# Patient Record
Sex: Female | Born: 2000 | Race: White | Hispanic: No | Marital: Married | Smoking: Never smoker
Health system: Southern US, Community
[De-identification: ages and names within clinical notes are randomized; demographics above are authoritative.]

## PROBLEM LIST (undated history)

## (undated) DIAGNOSIS — Z789 Other specified health status: Secondary | ICD-10-CM

## (undated) HISTORY — DX: Other specified health status: Z78.9

## (undated) HISTORY — PX: OTHER SURGICAL HISTORY: SHX169

---

## 2019-09-01 ENCOUNTER — Ambulatory Visit (INDEPENDENT_AMBULATORY_CARE_PROVIDER_SITE_OTHER): Payer: 59 | Admitting: Adult Health

## 2019-09-01 ENCOUNTER — Encounter: Payer: Self-pay | Admitting: Adult Health

## 2019-09-01 VITALS — BP 127/83 | HR 117 | Ht 60.0 in | Wt 115.0 lb

## 2019-09-01 DIAGNOSIS — O3680X0 Pregnancy with inconclusive fetal viability, not applicable or unspecified: Secondary | ICD-10-CM | POA: Insufficient documentation

## 2019-09-01 DIAGNOSIS — Z3201 Encounter for pregnancy test, result positive: Secondary | ICD-10-CM | POA: Insufficient documentation

## 2019-09-01 DIAGNOSIS — Z3A01 Less than 8 weeks gestation of pregnancy: Secondary | ICD-10-CM | POA: Diagnosis not present

## 2019-09-01 LAB — POCT URINE PREGNANCY: Preg Test, Ur: POSITIVE — AB

## 2019-09-01 NOTE — Progress Notes (Signed)
  Subjective:     Patient ID: Tracey Hays, female   DOB: 04-Dec-2000, 19 y.o.   MRN: [id number]  HPI Tracey Hays is a 19 year old white female, married, in for UPT has missed a period, and had 5 HPTs +. Husband Tracey Hays is with her today.  PCP is Tracey Hays.  Review of Systems +missed period 5+HPTs Reviewed past medical,surgical, social and family history. Reviewed medications and allergies.     Objective:   Physical Exam BP 127/83 (BP Location: Left Arm, Patient Position: Sitting, Cuff Size: Normal)   Pulse (!) 117   Ht 5' (1.524 m)   Wt 115 lb (52.2 kg)   LMP 07/20/2019 (Exact Date)   BMI 22.46 kg/m  UPT is +, about 6+1 week by LMP with EDD 04/25/20. Skin warm and dry. Neck: mid line trachea, normal thyroid, good ROM, no lymphadenopathy noted. Lungs: clear to ausculation bilaterally. Cardiovascular: regular rate and rhythm. Abdomen is soft and non tender QQ is 0 Fall risk is low PHQ 9 score is 2, no SI     Assessment:     1. Positive pregnancy test Continue PNV  2. Less than [redacted] weeks gestation of pregnancy Eat often  3. Encounter to determine fetal viability of pregnancy, single or unspecified fetus Return in 2 weeks for dating [place]     Plan:     Review hand out on First Trimester and by [facility]

## 2019-09-01 NOTE — Patient Instructions (Signed)
First Trimester of Pregnancy The first trimester of pregnancy is from week 1 until the end of week 13 (months 1 through 3). A week after a sperm fertilizes an egg, the egg will implant on the wall of the uterus. This embryo will begin to develop into a baby. Genes from you and your partner will form the baby. The female genes will determine whether the baby will be a boy or a girl. At 6-8 weeks, the eyes and face will be formed, and the heartbeat can be seen on ultrasound. At the end of 12 weeks, all the baby's organs will be formed. Now that you are pregnant, you will want to do everything you can to have a healthy baby. Two of the most important things are to get good prenatal care and to follow your health care provider's instructions. Prenatal care is all the medical care you receive before the baby's birth. This care will help prevent, find, and treat any problems during the pregnancy and childbirth. Body changes during your first trimester Your body goes through many changes during pregnancy. The changes vary from woman to woman.  You may gain or lose a couple of pounds at first.  You may feel sick to your stomach (nauseous) and you may throw up (vomit). If the vomiting is uncontrollable, call your health care provider.  You may tire easily.  You may develop headaches that can be relieved by medicines. All medicines should be approved by your health care provider.  You may urinate more often. Painful urination may mean you have a bladder infection.  You may develop heartburn as a result of your pregnancy.  You may develop constipation because certain hormones are causing the muscles that push stool through your intestines to slow down.  You may develop hemorrhoids or swollen veins (varicose veins).  Your breasts may begin to grow larger and become tender. Your nipples may stick out more, and the tissue that surrounds them (areola) may become darker.  Your gums may bleed and may be  sensitive to brushing and flossing.  Dark spots or blotches (chloasma, mask of pregnancy) may develop on your face. This will likely fade after the baby is born.  Your menstrual periods will stop.  You may have a loss of appetite.  You may develop cravings for certain kinds of food.  You may have changes in your emotions from day to day, such as being excited to be pregnant or being concerned that something may go wrong with the pregnancy and baby.  You may have more vivid and strange dreams.  You may have changes in your hair. These can include thickening of your hair, rapid growth, and changes in texture. Some women also have hair loss during or after pregnancy, or hair that feels dry or thin. Your hair will most likely return to normal after your baby is born. What to expect at prenatal visits During a routine prenatal visit:  You will be weighed to make sure you and the baby are growing normally.  Your blood pressure will be taken.  Your abdomen will be measured to track your baby's growth.  The fetal heartbeat will be listened to between weeks 10 and 14 of your pregnancy.  Test results from any previous visits will be discussed. Your health care provider may ask you:  How you are feeling.  If you are feeling the baby move.  If you have had any abnormal symptoms, such as leaking fluid, bleeding, severe headaches, or abdominal   cramping.  If you are using any tobacco products, including cigarettes, chewing tobacco, and electronic cigarettes.  If you have any questions. Other tests that may be performed during your first trimester include:  Blood tests to find your blood type and to check for the presence of any previous infections. The tests will also be used to check for low iron levels (anemia) and protein on red blood cells (Rh antibodies). Depending on your risk factors, or if you previously had diabetes during pregnancy, you may have tests to check for high blood sugar  that affects pregnant women (gestational diabetes).  Urine tests to check for infections, diabetes, or protein in the urine.  An ultrasound to confirm the proper growth and development of the baby.  Fetal screens for spinal cord problems (spina bifida) and Down syndrome.  HIV (human immunodeficiency virus) testing. Routine prenatal testing includes screening for HIV, unless you choose not to have this test.  You may need other tests to make sure you and the baby are doing well. Follow these instructions at home: Medicines  Follow your health care provider's instructions regarding medicine use. Specific medicines may be either safe or unsafe to take during pregnancy.  Take a prenatal vitamin that contains at least 600 micrograms (mcg) of folic acid.  If you develop constipation, try taking a stool softener if your health care provider approves. Eating and drinking   Eat a balanced diet that includes fresh fruits and vegetables, whole grains, good sources of protein such as meat, eggs, or tofu, and low-fat dairy. Your health care provider will help you determine the amount of weight gain that is right for you.  Avoid raw meat and uncooked cheese. These carry germs that can cause birth defects in the baby.  Eating four or five small meals rather than three large meals a day may help relieve nausea and vomiting. If you start to feel nauseous, eating a few soda crackers can be helpful. Drinking liquids between meals, instead of during meals, also seems to help ease nausea and vomiting.  Limit foods that are high in fat and processed sugars, such as fried and sweet foods.  To prevent constipation: ? Eat foods that are high in fiber, such as fresh fruits and vegetables, whole grains, and beans. ? Drink enough fluid to keep your urine clear or pale yellow. Activity  Exercise only as directed by your health care provider. Most women can continue their usual exercise routine during  pregnancy. Try to exercise for 30 minutes at least 5 days a week. Exercising will help you: ? Control your weight. ? Stay in shape. ? Be prepared for labor and delivery.  Experiencing pain or cramping in the lower abdomen or lower back is a good sign that you should stop exercising. Check with your health care provider before continuing with normal exercises.  Try to avoid standing for long periods of time. Move your legs often if you must stand in one place for a long time.  Avoid heavy lifting.  Wear low-heeled shoes and practice good posture.  You may continue to have sex unless your health care provider tells you not to. Relieving pain and discomfort  Wear a good support bra to relieve breast tenderness.  Take warm sitz baths to soothe any pain or discomfort caused by hemorrhoids. Use hemorrhoid cream if your health care provider approves.  Rest with your legs elevated if you have leg cramps or low back pain.  If you develop varicose veins in   your legs, wear support hose. Elevate your feet for 15 minutes, 3-4 times a day. Limit salt in your diet. Prenatal care  Schedule your prenatal visits by the twelfth week of pregnancy. They are usually scheduled monthly at first, then more often in the last 2 months before delivery.  Write down your questions. Take them to your prenatal visits.  Keep all your prenatal visits as told by your health care provider. This is important. Safety  Wear your seat belt at all times when driving.  Make a list of emergency phone numbers, including numbers for family, friends, the hospital, and police and fire departments. General instructions  Ask your health care provider for a referral to a local prenatal education class. Begin classes no later than the beginning of month 6 of your pregnancy.  Ask for help if you have counseling or nutritional needs during pregnancy. Your health care provider can offer advice or refer you to specialists for help  with various needs.  Do not use hot tubs, steam rooms, or saunas.  Do not douche or use tampons or scented sanitary pads.  Do not cross your legs for long periods of time.  Avoid cat litter boxes and soil used by cats. These carry germs that can cause birth defects in the baby and possibly loss of the fetus by miscarriage or stillbirth.  Avoid all smoking, herbs, alcohol, and medicines not prescribed by your health care provider. Chemicals in these products affect the formation and growth of the baby.  Do not use any products that contain nicotine or tobacco, such as cigarettes and e-cigarettes. If you need help quitting, ask your health care provider. You may receive counseling support and other resources to help you quit.  Schedule a dentist appointment. At home, brush your teeth with a soft toothbrush and be gentle when you floss. Contact a health care provider if:  You have dizziness.  You have mild pelvic cramps, pelvic pressure, or nagging pain in the abdominal area.  You have persistent nausea, vomiting, or diarrhea.  You have a bad smelling vaginal discharge.  You have pain when you urinate.  You notice increased swelling in your face, hands, legs, or ankles.  You are exposed to fifth disease or chickenpox.  You are exposed to German measles (rubella) and have never had it. Get help right away if:  You have a fever.  You are leaking fluid from your vagina.  You have spotting or bleeding from your vagina.  You have severe abdominal cramping or pain.  You have rapid weight gain or loss.  You vomit blood or material that looks like coffee grounds.  You develop a severe headache.  You have shortness of breath.  You have any kind of trauma, such as from a fall or a car accident. Summary  The first trimester of pregnancy is from week 1 until the end of week 13 (months 1 through 3).  Your body goes through many changes during pregnancy. The changes vary from  woman to woman.  You will have routine prenatal visits. During those visits, your health care provider will examine you, discuss any test results you may have, and talk with you about how you are feeling. This information is not intended to replace advice given to you by your health care provider. Make sure you discuss any questions you have with your health care provider. Document Revised: 02/12/2017 Document Reviewed: 02/12/2016 Elsevier Patient Education  2020 [employer]  

## 2019-09-20 ENCOUNTER — Ambulatory Visit (INDEPENDENT_AMBULATORY_CARE_PROVIDER_SITE_OTHER): Payer: 59

## 2019-09-20 DIAGNOSIS — O3680X0 Pregnancy with inconclusive fetal viability, not applicable or unspecified: Secondary | ICD-10-CM

## 2019-09-20 DIAGNOSIS — Z3A01 Less than 8 weeks gestation of pregnancy: Secondary | ICD-10-CM | POA: Diagnosis not present

## 2019-09-20 NOTE — Progress Notes (Signed)
[place] 8+6 wks,single IUP with ys,positive fht 176 bpm,CRL 21.54 mm,normal right ovary,simple left ovarian cyst 6.4 x 6.3 x 5.9 cm

## 2019-09-21 ENCOUNTER — Telehealth: Payer: Self-pay | Admitting: Women's Health

## 2019-09-21 ENCOUNTER — Telehealth: Payer: Self-pay | Admitting: *Deleted

## 2019-09-21 NOTE — Telephone Encounter (Signed)
Patient states she read in the new ob packet to avoid deli meats and hot dogs. Advised she can have those in small amounts and but warmed.  Also wants to know if she can use sunscreen.  Advised that was fine.  No further questions.

## 2019-09-21 NOTE — Telephone Encounter (Signed)
Patient called stating that she was reading the paperwork that was given to her on her first visit and it states that she should avoid deli meat and hot dogs, pt would like to know which ones or all. Please contact pt

## 2019-10-11 ENCOUNTER — Encounter: Payer: Self-pay | Admitting: Advanced Practice Midwife

## 2019-10-11 ENCOUNTER — Other Ambulatory Visit: Payer: 59

## 2019-10-11 ENCOUNTER — Ambulatory Visit: Payer: 59 | Admitting: *Deleted

## 2019-10-11 ENCOUNTER — Ambulatory Visit (INDEPENDENT_AMBULATORY_CARE_PROVIDER_SITE_OTHER): Payer: 59 | Admitting: Advanced Practice Midwife

## 2019-10-11 VITALS — BP 119/78 | HR 100 | Wt 115.0 lb

## 2019-10-11 DIAGNOSIS — Z3401 Encounter for supervision of normal first pregnancy, first trimester: Secondary | ICD-10-CM | POA: Diagnosis not present

## 2019-10-11 DIAGNOSIS — Z3A11 11 weeks gestation of pregnancy: Secondary | ICD-10-CM

## 2019-10-11 DIAGNOSIS — Z3400 Encounter for supervision of normal first pregnancy, unspecified trimester: Secondary | ICD-10-CM | POA: Insufficient documentation

## 2019-10-11 LAB — POCT URINALYSIS DIPSTICK OB
Blood, UA: NEGATIVE
Glucose, UA: NEGATIVE
Ketones, UA: NEGATIVE
Leukocytes, UA: NEGATIVE
Nitrite, UA: NEGATIVE
POC,PROTEIN,UA: NEGATIVE

## 2019-10-11 NOTE — Progress Notes (Signed)
INITIAL OBSTETRICAL VISIT Patient name: Tracey Hays MRN [id number]  Date of birth: [birth year] Chief Complaint:   Initial Prenatal Visit  History of Present Illness:   Tracey Hays is a 19 y.o. G1P0 Caucasian female at [redacted]w[redacted]d by LMP c/w u/s at 8 weeks with an Estimated Date of Delivery: 04/25/20 being seen today for her initial obstetrical visit.   Her obstetrical history is significant for primigravida.   Today she reports no complaints.  Depression screen [facility] 2/9 10/11/2019 09/01/2019  Decreased Interest 0 0  Down, Depressed, Hopeless 1 1  PHQ - 2 Score 1 1  Altered sleeping 1 0  Tired, decreased energy 1 1  Change in appetite 0 0  Feeling bad or failure about yourself  0 0  Trouble concentrating 0 0  Moving slowly or fidgety/restless 0 0  Suicidal thoughts 0 0  PHQ-9 Score 3 2  Difficult doing work/chores - Not difficult at all    Patient's last menstrual period was 07/20/2019 (exact date). Last pap never (19yo)-declines today.  Review of Systems:   Pertinent items are noted in HPI Denies cramping/contractions, leakage of fluid, vaginal bleeding, abnormal vaginal discharge w/ itching/odor/irritation, headaches, visual changes, shortness of breath, chest pain, abdominal pain, severe nausea/vomiting, or problems with urination or bowel movements unless otherwise stated above.  Pertinent History Reviewed:  Reviewed past medical,surgical, social, obstetrical and family history.  Reviewed problem list, medications and allergies. OB History  Gravida Para Term Preterm AB Living  1            SAB TAB Ectopic Multiple Live Births               # Outcome Date GA Lbr Len/2nd Weight Sex Delivery Anes PTL Lv  1 Current            Physical Assessment:   Vitals:   10/11/19 1516  BP: 119/78  Pulse: 100  Weight: 115 lb (52.2 kg)  Body mass index is 22.46 kg/m.       Physical Examination:  General appearance - well appearing, and in no distress  Mental status - alert, oriented  to person, place, and time  Psych:  She has a normal mood and affect  Skin - warm and dry, normal color, no suspicious lesions noted  Chest - effort normal, all lung fields clear to auscultation bilaterally  Heart - normal rate and regular rhythm  Abdomen - soft, nontender  Extremities:  No swelling or varicosities noted  Pelvic - not indicated as declines Pap  Thin prep pap is not done  Declines NT; FHT dopplered at 169 bpm  Results for orders placed or performed in visit on 10/11/19 (from the past 24 hour(s))  POC Urinalysis Dipstick OB   Collection Time: 10/11/19  3:35 PM  Result Value Ref Range   Color, UA     Clarity, UA     Glucose, UA Negative Negative   Bilirubin, UA     Ketones, UA neg    Spec Grav, UA     Blood, UA neg    pH, UA     POC,PROTEIN,UA Negative Negative, Trace, Small (1+), Moderate (2+), Large (3+), 4+   Urobilinogen, UA     Nitrite, UA neg    Leukocytes, UA Negative Negative   Appearance     Odor      Assessment & Plan:  1) Low-Risk Pregnancy G1P0 at [redacted]w[redacted]d with an Estimated Date of Delivery: 04/25/20   2) Initial OB visit; declines genetic  screening   Meds: No orders of the defined types were placed in this encounter.   Initial labs obtained Continue prenatal vitamins Reviewed n/v relief measures and warning s/s to report Reviewed recommended weight gain based on pre-gravid BMI Encouraged well-balanced diet Genetic & carrier screening discussed: declines Panorama, NT/IT, AFP and Horizon 14  Ultrasound discussed; fetal survey: requested CCNC completed> form faxed if has or is planning to apply for medicaid The nature of [employer] for [employer] with multiple MDs and other Advanced Practice Providers was explained to patient; also emphasized that fellows, residents, and students are part of our team. Ordered home bp cuff. Check bp weekly, let us know if >140/90.   No indications for ASA therapy or early Hgb A1c (per  uptodate)  Follow-up: Return in about 4 weeks (around 11/08/2019) for LROB, in person.   Orders Placed This Encounter  Procedures  . GC/Chlamydia Probe Amp  . Urine Culture  . CBC/D/Plt+RPR+Rh+ABO+Rub Ab...  . Pain Management Screening Profile (10S)  . Hgb Fractionation Cascade  . POC Urinalysis Dipstick OB    Arabella Merles [facility] 10/11/2019 4:12 PM

## 2019-10-11 NOTE — Patient Instructions (Signed)
[employer], I greatly value your feedback.  If you receive a survey following your visit with [place] today, we appreciate you taking the time to fill it out.  Thanks, Philipp Deputy, CNM   [facility] ([address] [place], [place] [address]) Entrance C, located off of [address] Free 24/7 valet parking   Nausea & Vomiting  Have saltine crackers or pretzels by your bed and eat a few bites before you raise your head out of bed in the morning  Eat small frequent meals throughout the day instead of large meals  Drink plenty of fluids throughout the day to stay hydrated, just don't drink a lot of fluids with your meals.  This can make your stomach fill up faster making you feel sick  Do not brush your teeth right after you eat  Products with real ginger are good for nausea, like ginger ale and ginger hard candy Make sure it says made with real ginger!  Sucking on sour candy like lemon heads is also good for nausea  If your prenatal vitamins make you nauseated, take them at night so you will sleep through the nausea  Sea Bands  If you feel like you need medicine for the nausea & vomiting please let us know  If you are unable to keep any fluids or food down please let us know   Constipation  Drink plenty of fluid, preferably water, throughout the day  Eat foods high in fiber such as fruits, vegetables, and grains  Exercise, such as walking, is a good way to keep your bowels regular  Drink warm fluids, especially warm prune juice, or decaf coffee  Eat a 1/2 cup of real oatmeal (not instant), 1/2 cup applesauce, and 1/2-1 cup warm prune juice every day  If needed, you may take Colace (docusate sodium) stool softener once or twice a day to help keep the stool soft.   If you still are having problems with constipation, you may take Miralax once daily as needed to help keep your bowels regular.   Home Blood Pressure Monitoring for Patients   Your  provider has recommended that you check your blood pressure (BP) at least once a week at home. If you do not have a blood pressure cuff at home, one will be provided for you. Contact your provider if you have not received your monitor within 1 week.   Helpful Tips for Accurate Home Blood Pressure Checks  . Don't smoke, exercise, or drink caffeine 30 minutes before checking your BP . Use the restroom before checking your BP (a full bladder can raise your pressure) . Relax in a comfortable upright chair . Feet on the ground . Left arm resting comfortably on a flat surface at the level of your heart . Legs uncrossed . Back supported . Sit quietly and don't talk . Place the cuff on your bare arm . Adjust snuggly, so that only two fingertips can fit between your skin and the top of the cuff . Check 2 readings separated by at least one minute . Keep a log of your BP readings . For a visual, please reference this diagram: http://ccnc.care/bpdiagram  Provider Name: [facility]     Phone: [phone]  Zone 1: ALL CLEAR  Continue to monitor your symptoms:  . BP reading is less than 140 (top number) or less than 90 (bottom number)  . No right upper stomach pain . No headaches or seeing spots .  No feeling nauseated or throwing up . No swelling in face and hands  Zone 2: CAUTION Call your doctor's office for any of the following:  . BP reading is greater than 140 (top number) or greater than 90 (bottom number)  . Stomach pain under your ribs in the middle or right side . Headaches or seeing spots . Feeling nauseated or throwing up . Swelling in face and hands  Zone 3: EMERGENCY  Seek immediate medical care if you have any of the following:  . BP reading is greater than160 (top number) or greater than 110 (bottom number) . Severe headaches not improving with Tylenol . Serious difficulty catching your breath . Any worsening symptoms from Zone 2    First Trimester of Pregnancy The  first trimester of pregnancy is from week 1 until the end of week 12 (months 1 through 3). A week after a sperm fertilizes an egg, the egg will implant on the wall of the uterus. This embryo will begin to develop into a baby. Genes from you and your partner are forming the baby. The female genes determine whether the baby is a boy or a girl. At 6-8 weeks, the eyes and face are formed, and the heartbeat can be seen on ultrasound. At the end of 12 weeks, all the baby's organs are formed.  Now that you are pregnant, you will want to do everything you can to have a healthy baby. Two of the most important things are to get good prenatal care and to follow your health care provider's instructions. Prenatal care is all the medical care you receive before the baby's birth. This care will help prevent, find, and treat any problems during the pregnancy and childbirth. BODY CHANGES Your body goes through many changes during pregnancy. The changes vary from woman to woman.   You may gain or lose a couple of pounds at first.  You may feel sick to your stomach (nauseous) and throw up (vomit). If the vomiting is uncontrollable, call your health care provider.  You may tire easily.  You may develop headaches that can be relieved by medicines approved by your health care provider.  You may urinate more often. Painful urination may mean you have a bladder infection.  You may develop heartburn as a result of your pregnancy.  You may develop constipation because certain hormones are causing the muscles that push waste through your intestines to slow down.  You may develop hemorrhoids or swollen, bulging veins (varicose veins).  Your breasts may begin to grow larger and become tender. Your nipples may stick out more, and the tissue that surrounds them (areola) may become darker.  Your gums may bleed and may be sensitive to brushing and flossing.  Dark spots or blotches (chloasma, mask of pregnancy) may develop on  your face. This will likely fade after the baby is born.  Your menstrual periods will stop.  You may have a loss of appetite.  You may develop cravings for certain kinds of food.  You may have changes in your emotions from day to day, such as being excited to be pregnant or being concerned that something may go wrong with the pregnancy and baby.  You may have more vivid and strange dreams.  You may have changes in your hair. These can include thickening of your hair, rapid growth, and changes in texture. Some women also have hair loss during or after pregnancy, or hair that feels dry or thin. Your hair will most  likely return to normal after your baby is born. WHAT TO EXPECT AT YOUR PRENATAL VISITS During a routine prenatal visit:  You will be weighed to make sure you and the baby are growing normally.  Your blood pressure will be taken.  Your abdomen will be measured to track your baby's growth.  The fetal heartbeat will be listened to starting around week 10 or 12 of your pregnancy.  Test results from any previous visits will be discussed. Your health care provider may ask you:  How you are feeling.  If you are feeling the baby move.  If you have had any abnormal symptoms, such as leaking fluid, bleeding, severe headaches, or abdominal cramping.  If you have any questions. Other tests that may be performed during your first trimester include:  Blood tests to find your blood type and to check for the presence of any previous infections. They will also be used to check for low iron levels (anemia) and Rh antibodies. Later in the pregnancy, blood tests for diabetes will be done along with other tests if problems develop.  Urine tests to check for infections, diabetes, or protein in the urine.  An ultrasound to confirm the proper growth and development of the baby.  An amniocentesis to check for possible genetic problems.  Fetal screens for spina bifida and Down  syndrome.  You may need other tests to make sure you and the baby are doing well. HOME CARE INSTRUCTIONS  Medicines  Follow your health care provider's instructions regarding medicine use. Specific medicines may be either safe or unsafe to take during pregnancy.  Take your prenatal vitamins as directed.  If you develop constipation, try taking a stool softener if your health care provider approves. Diet  Eat regular, well-balanced meals. Choose a variety of foods, such as meat or vegetable-based protein, fish, milk and low-fat dairy products, vegetables, fruits, and whole grain breads and cereals. Your health care provider will help you determine the amount of weight gain that is right for you.  Avoid raw meat and uncooked cheese. These carry germs that can cause birth defects in the baby.  Eating four or five small meals rather than three large meals a day may help relieve nausea and vomiting. If you start to feel nauseous, eating a few soda crackers can be helpful. Drinking liquids between meals instead of during meals also seems to help nausea and vomiting.  If you develop constipation, eat more high-fiber foods, such as fresh vegetables or fruit and whole grains. Drink enough fluids to keep your urine clear or pale yellow. Activity and Exercise  Exercise only as directed by your health care provider. Exercising will help you:  Control your weight.  Stay in shape.  Be prepared for labor and delivery.  Experiencing pain or cramping in the lower abdomen or low back is a good sign that you should stop exercising. Check with your health care provider before continuing normal exercises.  Try to avoid standing for long periods of time. Move your legs often if you must stand in one place for a long time.  Avoid heavy lifting.  Wear low-heeled shoes, and practice good posture.  You may continue to have sex unless your health care provider directs you otherwise. Relief of Pain or  Discomfort  Wear a good support bra for breast tenderness.    Take warm sitz baths to soothe any pain or discomfort caused by hemorrhoids. Use hemorrhoid cream if your health care provider approves.  Rest with your legs elevated if you have leg cramps or low back pain.  If you develop varicose veins in your legs, wear support hose. Elevate your feet for 15 minutes, 3-4 times a day. Limit salt in your diet. Prenatal Care  Schedule your prenatal visits by the twelfth week of pregnancy. They are usually scheduled monthly at first, then more often in the last 2 months before delivery.  Write down your questions. Take them to your prenatal visits.  Keep all your prenatal visits as directed by your health care provider. Safety  Wear your seat belt at all times when driving.  Make a list of emergency phone numbers, including numbers for family, friends, the hospital, and police and fire departments. General Tips  Ask your health care provider for a referral to a local prenatal education class. Begin classes no later than at the beginning of month 6 of your pregnancy.  Ask for help if you have counseling or nutritional needs during pregnancy. Your health care provider can offer advice or refer you to specialists for help with various needs.  Do not use hot tubs, steam rooms, or saunas.  Do not douche or use tampons or scented sanitary pads.  Do not cross your legs for long periods of time.  Avoid cat litter boxes and soil used by cats. These carry germs that can cause birth defects in the baby and possibly loss of the fetus by miscarriage or stillbirth.  Avoid all smoking, herbs, alcohol, and medicines not prescribed by your health care provider. Chemicals in these affect the formation and growth of the baby.  Schedule a dentist appointment. At home, brush your teeth with a soft toothbrush and be gentle when you floss. SEEK MEDICAL CARE IF:   You have dizziness.  You have mild  pelvic cramps, pelvic pressure, or nagging pain in the abdominal area.  You have persistent nausea, vomiting, or diarrhea.  You have a bad smelling vaginal discharge.  You have pain with urination.  You notice increased swelling in your face, hands, legs, or ankles. SEEK IMMEDIATE MEDICAL CARE IF:   You have a fever.  You are leaking fluid from your vagina.  You have spotting or bleeding from your vagina.  You have severe abdominal cramping or pain.  You have rapid weight gain or loss.  You vomit blood or material that looks like coffee grounds.  You are exposed to [place] measles and have never had them.  You are exposed to fifth disease or chickenpox.  You develop a severe headache.  You have shortness of breath.  You have any kind of trauma, such as from a fall or a car accident. Document Released: 02/24/2001 Document Revised: 07/17/2013 Document Reviewed: 01/10/2013 [facility] Patient Information 2015 [place], [place]. This information is not intended to replace advice given to you by your health care provider. Make sure you discuss any questions you have with your health care provider.

## 2019-10-13 ENCOUNTER — Encounter: Payer: Self-pay | Admitting: Advanced Practice Midwife

## 2019-10-13 DIAGNOSIS — Z2839 Other underimmunization status: Secondary | ICD-10-CM | POA: Insufficient documentation

## 2019-10-13 LAB — HGB FRACTIONATION CASCADE
Hgb A2: 2.8 % (ref 1.8–3.2)
Hgb A: 97.2 % (ref 96.4–98.8)
Hgb F: 0 % (ref 0.0–2.0)
Hgb S: 0 %

## 2019-10-13 LAB — CBC/D/PLT+RPR+RH+ABO+RUB AB...
Antibody Screen: NEGATIVE
Basophils Absolute: 0 10*3/uL (ref 0.0–0.2)
Basos: 0 %
EOS (ABSOLUTE): 0 10*3/uL (ref 0.0–0.4)
Eos: 0 %
HCV Ab: <0.1 s/co ratio (ref 0.0–0.9)
HIV Screen 4th Generation wRfx: NONREACTIVE
Hematocrit: 39.4 % (ref 34.0–46.6)
Hemoglobin: 13.5 g/dL (ref 11.1–15.9)
Hepatitis B Surface Ag: NEGATIVE
Immature Grans (Abs): 0 10*3/uL (ref 0.0–0.1)
Immature Granulocytes: 0 %
Lymphocytes Absolute: 1.2 10*3/uL (ref 0.7–3.1)
Lymphs: 10 %
MCH: 30.5 pg (ref 26.6–33.0)
MCHC: 34.3 g/dL (ref 31.5–35.7)
MCV: 89 fL (ref 79–97)
Monocytes Absolute: 0.8 10*3/uL (ref 0.1–0.9)
Monocytes: 7 %
Neutrophils Absolute: 10 10*3/uL — ABNORMAL HIGH (ref 1.4–7.0)
Neutrophils: 83 %
Platelets: 235 10*3/uL (ref 150–450)
RBC: 4.43 x10E6/uL (ref 3.77–5.28)
RDW: 12.5 % (ref 11.7–15.4)
RPR Ser Ql: NONREACTIVE
Rh Factor: POSITIVE
Rubella Antibodies, IGG: <0.9 index — ABNORMAL LOW (ref >0.99)
WBC: 12 10*3/uL — ABNORMAL HIGH (ref 3.4–10.8)

## 2019-10-13 LAB — PMP SCREEN PROFILE (10S), URINE
Amphetamine Scrn, Ur: NEGATIVE ng/mL
BARBITURATE SCREEN URINE: NEGATIVE ng/mL
BENZODIAZEPINE SCREEN, URINE: NEGATIVE ng/mL
CANNABINOIDS UR QL SCN: NEGATIVE ng/mL
Cocaine (Metab) Scrn, Ur: NEGATIVE ng/mL
Creatinine(Crt), U: 116.3 mg/dL (ref 20.0–300.0)
Methadone Screen, Urine: NEGATIVE ng/mL
OXYCODONE+OXYMORPHONE UR QL SCN: NEGATIVE ng/mL
Opiate Scrn, Ur: NEGATIVE ng/mL
Ph of Urine: 7.2 (ref 4.5–8.9)
Phencyclidine Qn, Ur: NEGATIVE ng/mL
Propoxyphene Scrn, Ur: NEGATIVE ng/mL

## 2019-10-13 LAB — URINE CULTURE

## 2019-10-13 LAB — GC/CHLAMYDIA PROBE AMP
Chlamydia trachomatis, NAA: NEGATIVE
Neisseria Gonorrhoeae by PCR: NEGATIVE

## 2019-10-13 LAB — HCV INTERPRETATION

## 2019-10-25 ENCOUNTER — Telehealth: Payer: Self-pay | Admitting: Obstetrics & Gynecology

## 2019-10-25 ENCOUNTER — Telehealth: Payer: Self-pay | Admitting: *Deleted

## 2019-10-25 ENCOUNTER — Other Ambulatory Visit (INDEPENDENT_AMBULATORY_CARE_PROVIDER_SITE_OTHER): Payer: 59 | Admitting: *Deleted

## 2019-10-25 VITALS — BP 122/80 | HR 122

## 2019-10-25 DIAGNOSIS — Z3401 Encounter for supervision of normal first pregnancy, first trimester: Secondary | ICD-10-CM

## 2019-10-25 DIAGNOSIS — O3620X0 Maternal care for hydrops fetalis, unspecified trimester, not applicable or unspecified: Secondary | ICD-10-CM

## 2019-10-25 LAB — POCT URINALYSIS DIPSTICK OB
Blood, UA: NEGATIVE
Glucose, UA: NEGATIVE
Ketones, UA: NEGATIVE
Nitrite, UA: NEGATIVE
POC,PROTEIN,UA: NEGATIVE

## 2019-10-25 NOTE — Progress Notes (Signed)
Chart reviewed for nurse visit. Agree with plan of care.  Adline Potter, NP 10/25/2019 3:42 PM

## 2019-10-25 NOTE — Progress Notes (Signed)
   NURSE VISIT- Fetal Heart Rate Check  SUBJECTIVE:  Lacosta Hargan is a 19 y.o. G1P0 female at [redacted]w[redacted]d, here for a fetal heart rate check. Wiped very light blood x1 last night, not currently bleeding or pain.  OBJECTIVE:  BP 122/80   Pulse (!) 122   LMP 07/20/2019 (Exact Date)   Appears well, no apparent distress  FHR 165. Ultrasound used as well.  Fetal movement visualized.   ASSESSMENT: G1P0 at [redacted]w[redacted]d with present fetal heart rate and movement on u/s.  PLAN: Follow-up as scheduled.  Advised to call or go to [facility] if bleeding returns or UTI symptoms develop.   Debbe Odea Jehan Ranganathan  10/25/2019 3:27 PM

## 2019-10-25 NOTE — Telephone Encounter (Signed)
Patient states she noticed a very light spot of blood last night after using the restroom.  Mild cramping around that time as well but no current bleeding or cramping.  Last intercourse 9 days ago.  Denies burning with urination but is having some pressure.  Will have patient come in for urine dip and fetal heart tone check.  Pt agreeable to come.

## 2019-10-25 NOTE — Telephone Encounter (Signed)
Patient states she called the after hours nurse late last night who advised her to monitor the bleeding overnight and call the office in the morning. Patient states she is still having some bleeding and was wondering if we could work her into be seen sooner than her current scheduled appointment on 11/08/19.  Please advise.

## 2019-10-26 ENCOUNTER — Other Ambulatory Visit: Payer: Self-pay | Admitting: Obstetrics and Gynecology

## 2019-10-26 ENCOUNTER — Ambulatory Visit (INDEPENDENT_AMBULATORY_CARE_PROVIDER_SITE_OTHER): Payer: 59

## 2019-10-26 ENCOUNTER — Telehealth: Payer: Self-pay | Admitting: Adult Health

## 2019-10-26 ENCOUNTER — Telehealth: Payer: Self-pay | Admitting: *Deleted

## 2019-10-26 DIAGNOSIS — Z3A14 14 weeks gestation of pregnancy: Secondary | ICD-10-CM | POA: Diagnosis not present

## 2019-10-26 DIAGNOSIS — O26852 Spotting complicating pregnancy, second trimester: Secondary | ICD-10-CM | POA: Diagnosis not present

## 2019-10-26 DIAGNOSIS — Z3402 Encounter for supervision of normal first pregnancy, second trimester: Secondary | ICD-10-CM

## 2019-10-26 NOTE — Telephone Encounter (Signed)
Pt is having bleeding , was here yesterday she said and was told to call back if she still had the bleeding. She is concerned because she still does. I told pt I could send a message back but she could send a my chart message back and may get a faster response. Pt wanted me to take the phone message. Please call pt and advise

## 2019-10-26 NOTE — Progress Notes (Signed)
[place] 14 wks,measurements c/w dates,fhr 154 bpm,anterior placenta gr 0,normal right ovary,simple left corpus luteal cyst 2.5 x 1.6 x 1.9 cm,cx 4 cm,subchorionic hemorrhage extending behind the tip of the placenta 4.4 x 1.7 x 3 cm,efw 37%,discussed results with Dr.Ferguson and PT.

## 2019-10-26 NOTE — Telephone Encounter (Signed)
Patient states she is still noticing blood "specks" in the toilet.  Denies cramping but is concerned.  Discussed with Dr Emelda Fear and will have patient come in today for u/s.

## 2019-11-08 ENCOUNTER — Encounter: Payer: Self-pay | Admitting: Advanced Practice Midwife

## 2019-11-08 ENCOUNTER — Ambulatory Visit (INDEPENDENT_AMBULATORY_CARE_PROVIDER_SITE_OTHER): Payer: 59 | Admitting: Advanced Practice Midwife

## 2019-11-08 VITALS — BP 127/81 | HR 114 | Wt 116.0 lb

## 2019-11-08 DIAGNOSIS — Z3A15 15 weeks gestation of pregnancy: Secondary | ICD-10-CM

## 2019-11-08 DIAGNOSIS — Z331 Pregnant state, incidental: Secondary | ICD-10-CM

## 2019-11-08 DIAGNOSIS — Z3402 Encounter for supervision of normal first pregnancy, second trimester: Secondary | ICD-10-CM

## 2019-11-08 DIAGNOSIS — Z1389 Encounter for screening for other disorder: Secondary | ICD-10-CM | POA: Diagnosis not present

## 2019-11-08 LAB — POCT URINALYSIS DIPSTICK OB
Glucose, UA: NEGATIVE
Ketones, UA: NEGATIVE
Nitrite, UA: NEGATIVE
POC,PROTEIN,UA: NEGATIVE

## 2019-11-08 NOTE — Progress Notes (Signed)
° °  LOW-RISK PREGNANCY VISIT Patient name: Tracey Hays MRN [id number]  Date of birth: [birth year] Chief Complaint:   Routine Prenatal Visit  History of Present Illness:   Tracey Hays is a 19 y.o. G1P0 female at 18w6dwith an Estimated Date of Delivery: 04/25/20 being seen today for ongoing management of a low-risk pregnancy. Spotting daily, pap [facility] has intermittent spotting; no pain; questions re purpose of Pap smear- discussed; would like to wait until postpartum. Contractions: Not present. Vag. Bleeding: Small.  Movement: Present. denies leaking of fluid. Review of Systems:   Pertinent items are noted in HPI Denies abnormal vaginal discharge w/ itching/odor/irritation, headaches, visual changes, shortness of breath, chest pain, abdominal pain, severe nausea/vomiting, or problems with urination or bowel movements unless otherwise stated above. Pertinent History Reviewed:  Reviewed past medical,surgical, social, obstetrical and family history.  Reviewed problem list, medications and allergies. Physical Assessment:   Vitals:   11/08/19 1442  BP: 127/81  Pulse: (!) 114  Weight: 116 lb (52.6 kg)  Body mass index is 22.65 kg/m.        Physical Examination:   General appearance: Well appearing, and in no distress  Mental status: Alert, oriented to person, place, and time  Skin: Warm & dry  Cardiovascular: Normal heart rate noted  Respiratory: Normal respiratory effort, no distress  Abdomen: Soft, gravid, nontender  Pelvic: Cervical exam deferred         Extremities: Edema: None  Fetal Status: Fetal Heart Rate (bpm): 165   Movement: Present    Results for orders placed or performed in visit on 11/08/19 (from the past 24 hour(s))  POC Urinalysis Dipstick OB   Collection Time: 11/08/19  2:43 PM  Result Value Ref Range   Color, UA     Clarity, UA     Glucose, UA Negative Negative   Bilirubin, UA     Ketones, UA neg    Spec Grav, UA      Blood, UA 2+    pH, UA     POC,PROTEIN,UA Negative Negative, Trace, Small (1+), Moderate (2+), Large (3+), 4+   Urobilinogen, UA     Nitrite, UA neg    Leukocytes, UA Trace (A) Negative   Appearance     Odor      Assessment & Plan:  1) Low-risk pregnancy G1P0 at 164w6dith an Estimated Date of Delivery: 04/25/20   2) Reviewed rubella nonimmune, rec MMR vax PP  3) Declines Pap in pregnancy, will do postpartum  4) Hematuria, most likely from vag spotting; neg culture 7/28   Meds: No orders of the defined types were placed in this encounter.  Labs/procedures today: none  Plan:  Continue routine obstetrical care   Reviewed: Preterm labor symptoms and general obstetric precautions including but not limited to vaginal bleeding, contractions, leaking of fluid and fetal movement were reviewed in detail with the patient.  All questions were answered. Didn't ask about home bp cuff. Check bp weekly, let usKoreanow if >140/90.   Follow-up: Return in about 3 weeks (around 11/29/2019) for LR[place]stin person, [place].  Orders Placed This Encounter  Procedures   US[place]B Comp + 14 Wk   POC Urinalysis Dipstick OB   KiMyrtis SerNLafayette Surgery Center [facility] Partnership/25/2021 3:56 PM

## 2019-11-08 NOTE — Patient Instructions (Signed)
[employer], I greatly value your feedback.  If you receive a survey following your visit with [place] today, we appreciate you taking the time to fill it out.  Thanks, Philipp Deputy, CNM  [facility] ([address] [place], [place] [address]) Entrance C, located off of [address] valet parking  Go to [email or web address] to register for FREE online childbirth classes  [place] Pediatricians/Family Doctors:  [facility] [phone]            [facility] [phone]                 [facility] [phone] (usually not accepting new patients unless you have family there already, you are always welcome to call and ask)       [facility] Department [phone]       [facility] Pediatricians/Family Doctors:   [facility]: [phone]  [facility]: [phone]  [facility]: [phone]  [facility] Doctors:   [facility]: [phone]   [facility]: [phone]  [facility] Doctors:  [facility]: [phone]    Home Blood Pressure Monitoring for Patients   Your provider has recommended that you check your blood pressure (BP) at least once a week at home. If you do not have a blood pressure cuff at home, one will be provided for you. Contact your provider if you have not received your monitor within 1 week.   Helpful Tips for Accurate Home Blood Pressure Checks  . Don't smoke, exercise, or drink caffeine 30 minutes before checking your BP . Use the restroom before checking your BP (a full bladder can raise your pressure) . Relax in a comfortable upright chair . Feet on the ground . Left arm resting comfortably on a flat surface at the level of your heart . Legs uncrossed . Back supported . Sit quietly and don't talk . Place the cuff on your bare arm . Adjust snuggly, so that only two  fingertips can fit between your skin and the top of the cuff . Check 2 readings separated by at least one minute . Keep a log of your BP readings . For a visual, please reference this diagram: http://ccnc.care/bpdiagram  Provider Name: [facility]     Phone: [phone]  Zone 1: ALL CLEAR  Continue to monitor your symptoms:  . BP reading is less than 140 (top number) or less than 90 (bottom number)  . No right upper stomach pain . No headaches or seeing spots . No feeling nauseated or throwing up . No swelling in face and hands  Zone 2: CAUTION Call your doctor's office for any of the following:  . BP reading is greater than 140 (top number) or greater than 90 (bottom number)  . Stomach pain under your ribs in the middle or right side . Headaches or seeing spots . Feeling nauseated or throwing up . Swelling in face and hands  Zone 3: EMERGENCY  Seek immediate medical care if you have any of the following:  . BP reading is greater than160 (top number) or greater than 110 (bottom number) . Severe headaches not improving with Tylenol . Serious difficulty catching your breath . Any worsening symptoms from Zone 2     Second Trimester of Pregnancy The second trimester is from week 14 through week 27 (months 4 through 6). The second trimester is often a time when you feel your best. Your  body has adjusted to being pregnant, and you begin to feel better physically. Usually, morning sickness has lessened or quit completely, you may have more energy, and you may have an increase in appetite. The second trimester is also a time when the fetus is growing rapidly. At the end of the sixth month, the fetus is about 9 inches long and weighs about 1 pounds. You will likely begin to feel the baby move (quickening) between 16 and 20 weeks of pregnancy. Body changes during your second trimester Your body continues to go through many changes during your second trimester. The changes vary from  woman to woman.  Your weight will continue to increase. You will notice your lower abdomen bulging out.  You may begin to get stretch marks on your hips, abdomen, and breasts.  You may develop headaches that can be relieved by medicines. The medicines should be approved by your health care provider.  You may urinate more often because the fetus is pressing on your bladder.  You may develop or continue to have heartburn as a result of your pregnancy.  You may develop constipation because certain hormones are causing the muscles that push waste through your intestines to slow down.  You may develop hemorrhoids or swollen, bulging veins (varicose veins).  You may have back pain. This is caused by: ? Weight gain. ? Pregnancy hormones that are relaxing the joints in your pelvis. ? A shift in weight and the muscles that support your balance.  Your breasts will continue to grow and they will continue to become tender.  Your gums may bleed and may be sensitive to brushing and flossing.  Dark spots or blotches (chloasma, mask of pregnancy) may develop on your face. This will likely fade after the baby is born.  A dark line from your belly button to the pubic area (linea nigra) may appear. This will likely fade after the baby is born.  You may have changes in your hair. These can include thickening of your hair, rapid growth, and changes in texture. Some women also have hair loss during or after pregnancy, or hair that feels dry or thin. Your hair will most likely return to normal after your baby is born.  What to expect at prenatal visits During a routine prenatal visit:  You will be weighed to make sure you and the fetus are growing normally.  Your blood pressure will be taken.  Your abdomen will be measured to track your baby's growth.  The fetal heartbeat will be listened to.  Any test results from the previous visit will be discussed.  Your health care provider may ask  you:  How you are feeling.  If you are feeling the baby move.  If you have had any abnormal symptoms, such as leaking fluid, bleeding, severe headaches, or abdominal cramping.  If you are using any tobacco products, including cigarettes, chewing tobacco, and electronic cigarettes.  If you have any questions.  Other tests that may be performed during your second trimester include:  Blood tests that check for: ? Low iron levels (anemia). ? High blood sugar that affects pregnant women (gestational diabetes) between 64 and 28 weeks. ? Rh antibodies. This is to check for a protein on red blood cells (Rh factor).  Urine tests to check for infections, diabetes, or protein in the urine.  An ultrasound to confirm the proper growth and development of the baby.  An amniocentesis to check for possible genetic problems.  Fetal screens for  spina bifida and Down syndrome.  HIV (human immunodeficiency virus) testing. Routine prenatal testing includes screening for HIV, unless you choose not to have this test.  Follow these instructions at home: Medicines  Follow your health care provider's instructions regarding medicine use. Specific medicines may be either safe or unsafe to take during pregnancy.  Take a prenatal vitamin that contains at least 600 micrograms (mcg) of folic acid.  If you develop constipation, try taking a stool softener if your health care provider approves. Eating and drinking  Eat a balanced diet that includes fresh fruits and vegetables, whole grains, good sources of protein such as meat, eggs, or tofu, and low-fat dairy. Your health care provider will help you determine the amount of weight gain that is right for you.  Avoid raw meat and uncooked cheese. These carry germs that can cause birth defects in the baby.  If you have low calcium intake from food, talk to your health care provider about whether you should take a daily calcium supplement.  Limit foods that  are high in fat and processed sugars, such as fried and sweet foods.  To prevent constipation: ? Drink enough fluid to keep your urine clear or pale yellow. ? Eat foods that are high in fiber, such as fresh fruits and vegetables, whole grains, and beans. Activity  Exercise only as directed by your health care provider. Most women can continue their usual exercise routine during pregnancy. Try to exercise for 30 minutes at least 5 days a week. Stop exercising if you experience uterine contractions.  Avoid heavy lifting, wear low heel shoes, and practice good posture.  A sexual relationship may be continued unless your health care provider directs you otherwise. Relieving pain and discomfort  Wear a good support bra to prevent discomfort from breast tenderness.  Take warm sitz baths to soothe any pain or discomfort caused by hemorrhoids. Use hemorrhoid cream if your health care provider approves.  Rest with your legs elevated if you have leg cramps or low back pain.  If you develop varicose veins, wear support hose. Elevate your feet for 15 minutes, 3-4 times a day. Limit salt in your diet. Prenatal Care  Write down your questions. Take them to your prenatal visits.  Keep all your prenatal visits as told by your health care provider. This is important. Safety  Wear your seat belt at all times when driving.  Make a list of emergency phone numbers, including numbers for family, friends, the hospital, and police and fire departments. General instructions  Ask your health care provider for a referral to a local prenatal education class. Begin classes no later than the beginning of month 6 of your pregnancy.  Ask for help if you have counseling or nutritional needs during pregnancy. Your health care provider can offer advice or refer you to specialists for help with various needs.  Do not use hot tubs, steam rooms, or saunas.  Do not douche or use tampons or scented sanitary  pads.  Do not cross your legs for long periods of time.  Avoid cat litter boxes and soil used by cats. These carry germs that can cause birth defects in the baby and possibly loss of the fetus by miscarriage or stillbirth.  Avoid all smoking, herbs, alcohol, and unprescribed drugs. Chemicals in these products can affect the formation and growth of the baby.  Do not use any products that contain nicotine or tobacco, such as cigarettes and e-cigarettes. If you need help quitting, ask  your health care provider.  Visit your dentist if you have not gone yet during your pregnancy. Use a soft toothbrush to brush your teeth and be gentle when you floss. Contact a health care provider if:  You have dizziness.  You have mild pelvic cramps, pelvic pressure, or nagging pain in the abdominal area.  You have persistent nausea, vomiting, or diarrhea.  You have a bad smelling vaginal discharge.  You have pain when you urinate. Get help right away if:  You have a fever.  You are leaking fluid from your vagina.  You have spotting or bleeding from your vagina.  You have severe abdominal cramping or pain.  You have rapid weight gain or weight loss.  You have shortness of breath with chest pain.  You notice sudden or extreme swelling of your face, hands, ankles, feet, or legs.  You have not felt your baby move in over an hour.  You have severe headaches that do not go away when you take medicine.  You have vision changes. Summary  The second trimester is from week 14 through week 27 (months 4 through 6). It is also a time when the fetus is growing rapidly.  Your body goes through many changes during pregnancy. The changes vary from woman to woman.  Avoid all smoking, herbs, alcohol, and unprescribed drugs. These chemicals affect the formation and growth your baby.  Do not use any tobacco products, such as cigarettes, chewing tobacco, and e-cigarettes. If you need help quitting, ask your  health care provider.  Contact your health care provider if you have any questions. Keep all prenatal visits as told by your health care provider. This is important. This information is not intended to replace advice given to you by your health care provider. Make sure you discuss any questions you have with your health care provider. Document Released: 02/24/2001 Document Revised: 08/08/2015 Document Reviewed: 05/03/2012 Elsevier Interactive Patient Education  2017 [employer].

## 2019-11-16 ENCOUNTER — Inpatient Hospital Stay (HOSPITAL_COMMUNITY)
Admission: AD | Admit: 2019-11-16 | Discharge: 2019-11-17 | Disposition: A | Discharge status: Home / Self Care | Payer: 59 | Attending: Obstetrics & Gynecology | Admitting: Obstetrics & Gynecology

## 2019-11-16 ENCOUNTER — Other Ambulatory Visit: Payer: Self-pay

## 2019-11-16 ENCOUNTER — Encounter (HOSPITAL_COMMUNITY): Payer: Self-pay | Admitting: Obstetrics & Gynecology

## 2019-11-16 DIAGNOSIS — O26892 Other specified pregnancy related conditions, second trimester: Secondary | ICD-10-CM | POA: Insufficient documentation

## 2019-11-16 DIAGNOSIS — R109 Unspecified abdominal pain: Secondary | ICD-10-CM | POA: Insufficient documentation

## 2019-11-16 DIAGNOSIS — Z2839 Other underimmunization status: Secondary | ICD-10-CM

## 2019-11-16 DIAGNOSIS — Z3A17 17 weeks gestation of pregnancy: Secondary | ICD-10-CM | POA: Insufficient documentation

## 2019-11-16 DIAGNOSIS — Z3402 Encounter for supervision of normal first pregnancy, second trimester: Secondary | ICD-10-CM

## 2019-11-16 DIAGNOSIS — O034 Incomplete spontaneous abortion without complication: Secondary | ICD-10-CM | POA: Insufficient documentation

## 2019-11-16 DIAGNOSIS — O039 Complete or unspecified spontaneous abortion without complication: Secondary | ICD-10-CM | POA: Diagnosis present

## 2019-11-16 NOTE — MAU Note (Signed)
Pt reports she started having cramping yesterday. Got more intense today with bleeding. About 45 min ago baby delivered. Still having som bleeding fetus still attached.

## 2019-11-17 DIAGNOSIS — O034 Incomplete spontaneous abortion without complication: Secondary | ICD-10-CM | POA: Diagnosis not present

## 2019-11-17 DIAGNOSIS — Z3A17 17 weeks gestation of pregnancy: Secondary | ICD-10-CM | POA: Diagnosis not present

## 2019-11-17 DIAGNOSIS — O039 Complete or unspecified spontaneous abortion without complication: Secondary | ICD-10-CM

## 2019-11-17 DIAGNOSIS — R109 Unspecified abdominal pain: Secondary | ICD-10-CM | POA: Diagnosis not present

## 2019-11-17 DIAGNOSIS — O26892 Other specified pregnancy related conditions, second trimester: Secondary | ICD-10-CM | POA: Diagnosis not present

## 2019-11-17 LAB — CBC WITH DIFFERENTIAL/PLATELET
Abs Immature Granulocytes: 0.07 10*3/uL (ref 0.00–0.07)
Basophils Absolute: 0.1 10*3/uL (ref 0.0–0.1)
Basophils Relative: 0 %
Eosinophils Absolute: 0 10*3/uL (ref 0.0–0.5)
Eosinophils Relative: 0 %
HCT: 30.7 % — ABNORMAL LOW (ref 36.0–46.0)
Hemoglobin: 10.7 g/dL — ABNORMAL LOW (ref 12.0–15.0)
Immature Granulocytes: 1 %
Lymphocytes Relative: 6 %
Lymphs Abs: 0.9 10*3/uL (ref 0.7–4.0)
MCH: 30.7 pg (ref 26.0–34.0)
MCHC: 34.9 g/dL (ref 30.0–36.0)
MCV: 88 fL (ref 80.0–100.0)
Monocytes Absolute: 0.9 10*3/uL (ref 0.1–1.0)
Monocytes Relative: 6 %
Neutro Abs: 13.4 10*3/uL — ABNORMAL HIGH (ref 1.7–7.7)
Neutrophils Relative %: 87 %
Platelets: 229 10*3/uL (ref 150–400)
RBC: 3.49 MIL/uL — ABNORMAL LOW (ref 3.87–5.11)
RDW: 14.2 % (ref 11.5–15.5)
WBC: 15.3 10*3/uL — ABNORMAL HIGH (ref 4.0–10.5)
nRBC: 0 % (ref 0.0–0.2)

## 2019-11-17 LAB — COMPREHENSIVE METABOLIC PANEL
ALT: 43 U/L (ref 0–44)
AST: 45 U/L — ABNORMAL HIGH (ref 15–41)
Albumin: 3.3 g/dL — ABNORMAL LOW (ref 3.5–5.0)
Alkaline Phosphatase: 81 U/L (ref 38–126)
Anion gap: 10 (ref 5–15)
BUN: <5 mg/dL — ABNORMAL LOW (ref 6–20)
CO2: 20 mmol/L — ABNORMAL LOW (ref 22–32)
Calcium: 9.4 mg/dL (ref 8.9–10.3)
Chloride: 106 mmol/L (ref 98–111)
Creatinine, Ser: 0.48 mg/dL (ref 0.44–1.00)
GFR calc Af Amer: >60 mL/min (ref >60)
GFR calc non Af Amer: >60 mL/min (ref >60)
Glucose, Bld: 106 mg/dL — ABNORMAL HIGH (ref 70–99)
Potassium: 3.5 mmol/L (ref 3.5–5.1)
Sodium: 136 mmol/L (ref 135–145)
Total Bilirubin: 0.5 mg/dL (ref 0.3–1.2)
Total Protein: 6.3 g/dL — ABNORMAL LOW (ref 6.5–8.1)

## 2019-11-17 LAB — TYPE AND SCREEN
ABO/RH(D): O POS
Antibody Screen: NEGATIVE

## 2019-11-17 MED ORDER — PROMETHAZINE HCL 25 MG PO TABS: 25.0000 mg | ORAL_TABLET | Freq: Four times a day (QID) | ORAL | 2 refills | Status: DC | PRN

## 2019-11-17 MED ORDER — MISOPROSTOL 200 MCG PO TABS
800.0000 ug | ORAL_TABLET | Freq: Once | ORAL | Status: AC
  Administered 2019-11-17: 800 ug via BUCCAL
  Filled 2019-11-17: qty 4

## 2019-11-17 MED ORDER — PROMETHAZINE HCL 25 MG/ML IJ SOLN
25.0000 mg | Freq: Once | INTRAMUSCULAR | Status: AC
  Administered 2019-11-17: 25 mg via INTRAVENOUS
  Filled 2019-11-17: qty 1

## 2019-11-17 MED ORDER — SODIUM CHLORIDE 0.9 % IV SOLN
2.0000 g | INTRAVENOUS | Status: AC
  Administered 2019-11-17: 2 g via INTRAVENOUS
  Filled 2019-11-17: qty 2

## 2019-11-17 MED ORDER — AZITHROMYCIN 250 MG PO TABS
1000.0000 mg | ORAL_TABLET | Freq: Once | ORAL | Status: AC
  Administered 2019-11-17: 1000 mg via ORAL
  Filled 2019-11-17: qty 4

## 2019-11-17 MED ORDER — TRAMADOL HCL 50 MG PO TABS: 50.0000 mg | ORAL_TABLET | Freq: Four times a day (QID) | ORAL | 0 refills | Status: DC | PRN

## 2019-11-17 MED ORDER — FENTANYL CITRATE (PF) 100 MCG/2ML IJ SOLN
50.0000 ug | INTRAMUSCULAR | Status: DC | PRN
  Administered 2019-11-17: 50 ug via INTRAVENOUS
  Filled 2019-11-17: qty 2

## 2019-11-17 MED ORDER — LACTATED RINGERS IV SOLN: INTRAVENOUS | Status: DC

## 2019-11-17 MED ORDER — DOXYCYCLINE HYCLATE 100 MG PO CAPS: 100.0000 mg | ORAL_CAPSULE | Freq: Two times a day (BID) | ORAL | 0 refills | Status: DC

## 2019-11-17 MED ORDER — IBUPROFEN 800 MG PO TABS: 800.0000 mg | ORAL_TABLET | Freq: Three times a day (TID) | ORAL | 3 refills | Status: DC | PRN

## 2019-11-17 MED ORDER — METRONIDAZOLE 500 MG PO TABS: 500.0000 mg | ORAL_TABLET | Freq: Two times a day (BID) | ORAL | 0 refills | Status: AC

## 2019-11-17 MED ORDER — KETOROLAC TROMETHAMINE 30 MG/ML IJ SOLN
30.0000 mg | Freq: Once | INTRAMUSCULAR | Status: AC
  Administered 2019-11-17: 30 mg via INTRAVENOUS
  Filled 2019-11-17: qty 1

## 2019-11-17 NOTE — MAU Note (Addendum)
Pt and spouse discussed taking the fetus home vs leaving it with the hospital. THey have decided to leave the fetus at the hospital. THey asked about the baby's sex. Dr Macon Large was unable to visually determine the baby's sex and the couple is aware. Pt states on an earlier u/s they thought the baby was a girl. POC sent to pathology

## 2019-11-17 NOTE — Progress Notes (Signed)
WRitten and verbal d/c instructions given and understanding voiced. Understands has 5 scripts at pharmacy to pick up today. Heart pillow given to pt with emotional support. Spouse is very supportive of pt

## 2019-11-17 NOTE — MAU Provider Note (Signed)
[facility]N Attending MAU Note  Chief Complaint: Miscarriage  First Provider Initiated Contact with Patient 11/17/19 0010      SUBJECTIVE Tracey Hays is a 19 y.o. G1P0 at [redacted]w[redacted]d by LMP who presents after having miscarriage at home around 2300 on 11/16/19.  She is here with her FOB. She reports having bleeding starting at 14 weeks, ultrasound done at [facility] office showed a 4.4 x 1.7 x 3 cm subchorionic hemorrhage extending behind the tip of the placenta on ultrasound done 10/26/2019. She continued to have small amount of bleeding daily until yesterday when she started having intense cramping and more bleeding.  This culminated in delivery of previable fetus at home around 2300 today; she left the fetus attached in pad and underwear and came to [facility] for evaluation.  Reports ongoing small amount of bleeding and lower abdominal cramping.  Denies any fevers, chills, sweats, dysuria, nausea, vomiting, other GI or GU symptoms or other general symptoms. She does feel appropriately sad about the situation.     Past Medical History:  Diagnosis Date  . Medical history non-contributory    OB History  Gravida Para Term Preterm AB Living  1            SAB TAB Ectopic Multiple Live Births               # Outcome Date GA Lbr Len/2nd Weight Sex Delivery Anes PTL Lv  1 Current            Past Surgical History:  Procedure Laterality Date  . right arm surgery     fracture   Social History   Socioeconomic History  . Marital status: Married    Spouse name: Not on file  . Number of children: Not on file  . Years of education: Not on file  . Highest education level: Not on file  Occupational History  . Not on file  Tobacco Use  . Smoking status: Never Smoker  . Smokeless tobacco: Never Used  Vaping Use  . Vaping Use: Never used  Substance and Sexual Activity  . Alcohol use: Not Currently  . Drug use: Never  . Sexual activity: Yes    Birth control/protection: None  Other Topics  Concern  . Not on file  Social History Narrative  . Not on file   Social Determinants of Health   Financial Resource Strain: Low Risk   . Difficulty of Paying Living Expenses: Not hard at all  Food Insecurity: No Food Insecurity  . Worried About Programme researcher, broadcasting/film/video in the Last Year: Never true  . Ran Out of Food in the Last Year: Never true  Transportation Needs: No Transportation Needs  . Lack of Transportation (Medical): No  . Lack of Transportation (Non-Medical): No  Physical Activity: Insufficiently Active  . Days of Exercise per Week: 2 days  . Minutes of Exercise per Session: 10 min  Stress: No Stress Concern Present  . Feeling of Stress : Not at all  Social Connections: Moderately Integrated  . Frequency of Communication with Friends and Family: More than three times a week  . Frequency of Social Gatherings with Friends and Family: Once a week  . Attends Religious Services: More than 4 times per year  . Active Member of Clubs or Organizations: No  . Attends Banker Meetings: Never  . Marital Status: Married  Catering manager Violence: Not At Risk  . Fear of Current or Ex-Partner: No  . Emotionally Abused:  No  . Physically Abused: No  . Sexually Abused: No   No current facility-administered medications on file prior to encounter.   Current Outpatient Medications on File Prior to Encounter  Medication Sig Dispense Refill  . Prenatal Vit-Fe Fumarate-FA (PRENATAL VITAMIN PO) Take by mouth.     No Known Allergies  ROS: Pertinent items in HPI  OBJECTIVE BP 116/60 (BP Location: Right Arm)   Pulse 96   Temp 99.5 F (37.5 C)   Resp 18   LMP 07/20/2019 (Exact Date)  CONSTITUTIONAL: Well-developed, well-nourished female in no acute distress.  HENT:  Normocephalic, atraumatic, External right and left ear normal. Oropharynx is clear and moist EYES: Conjunctivae and EOM are normal. Pupils are equal, round, and reactive to light. No scleral icterus.  NECK:  Normal range of motion, supple, no masses.  Normal thyroid.  SKIN: Skin is warm and dry. No rash noted. Not diaphoretic. No erythema. No pallor. NEUROLGIC: Alert and oriented to person, place, and time. Normal reflexes, muscle tone coordination. No cranial nerve deficit noted. PSYCHIATRIC: Depressed mood and affect. Normal behavior. Normal judgment and thought content. CARDIOVASCULAR: Normal heart rate noted RESPIRATORY: Effort and breath sounds normal, no problems with respiration noted. ABDOMEN: Soft, normal bowel sounds, no distention noted.  No tenderness, rebound or guarding. MUSCULOSKELETAL: Normal range of motion. No tenderness.  No cyanosis, clubbing, or edema.  PELVIC: Previable fetus noted attached to very thin cord.  Fetus is edematous, and features are distorted by the edema.  Unable to elucidate gender of fetus.  Cord was clamped and cut.  Bimanual exam revealed some clots in vagina, placenta not palpated in vagina or lower uterine segment. Cervix is about 2 cm dilated.  Mild uterine tenderness on examination.      LAB RESULTS Results for orders placed or performed during the hospital encounter of 11/16/19 (from the past 48 hour(s))  Comprehensive metabolic panel     Status: Abnormal   Collection Time: 11/17/19 12:38 AM  Result Value Ref Range   Sodium 136 135 - 145 mmol/L   Potassium 3.5 3.5 - 5.1 mmol/L   Chloride 106 98 - 111 mmol/L   CO2 20 (L) 22 - 32 mmol/L   Glucose, Bld 106 (H) 70 - 99 mg/dL    Comment: Glucose reference range applies only to samples taken after fasting for at least 8 hours.   BUN <5 (L) 6 - 20 mg/dL   Creatinine, Ser 2.77 0.44 - 1.00 mg/dL   Calcium 9.4 8.9 - 82.4 mg/dL   Total Protein 6.3 (L) 6.5 - 8.1 g/dL   Albumin 3.3 (L) 3.5 - 5.0 g/dL   AST 45 (H) 15 - 41 U/L   ALT 43 0 - 44 U/L   Alkaline Phosphatase 81 38 - 126 U/L   Total Bilirubin 0.5 0.3 - 1.2 mg/dL   GFR calc non Af Amer >60 >60 mL/min   GFR calc Af Amer >60 >60 mL/min   Anion gap 10 5  - 15    Comment: Performed at [facility], [address] [place], [place] [address]  Type and screen [facility]     Status: None   Collection Time: 11/17/19 12:42 AM  Result Value Ref Range   ABO/RH(D) O POS    Antibody Screen NEG    Sample Expiration      11/20/2019,2359 Performed at [facility], [address] [place], [place] [address]   CBC with Differential/Platelet  Status: Abnormal   Collection Time: 11/17/19 12:52 AM  Result Value Ref Range   WBC 15.3 (H) 4.0 - 10.5 K/uL   RBC 3.49 (L) 3.87 - 5.11 MIL/uL   Hemoglobin 10.7 (L) 12.0 - 15.0 g/dL   HCT 16.130.7 (L) 36 - 46 %   MCV 88.0 80.0 - 100.0 fL   MCH 30.7 26.0 - 34.0 pg   MCHC 34.9 30.0 - 36.0 g/dL   RDW 09.614.2 04.511.5 - 40.915.5 %   Platelets 229 150 - 400 K/uL   nRBC 0.0 0.0 - 0.2 %   Neutrophils Relative % 87 %   Neutro Abs 13.4 (H) 1.7 - 7.7 K/uL   Lymphocytes Relative 6 %   Lymphs Abs 0.9 0.7 - 4.0 K/uL   Monocytes Relative 6 %   Monocytes Absolute 0.9 0 - 1 K/uL   Eosinophils Relative 0 %   Eosinophils Absolute 0.0 0 - 0 K/uL   Basophils Relative 0 %   Basophils Absolute 0.1 0 - 0 K/uL   Immature Granulocytes 1 %   Abs Immature Granulocytes 0.07 0.00 - 0.07 K/uL    Comment: Performed at [facility], [address] [place], [place] [id number]    IMAGING [facility]  Result Date: 10/30/2019 FOLLOW UP SONOGRAM Rea Collegethlyn Udell is in the office for a follow up sonogram for viability. She is a 19 y.o. year old G1P0 with Estimated Date of Delivery: 04/25/20 by LMP now at  [redacted]w[redacted]d weeks gestation. Thus far the pregnancy has been complicated by spotting. GESTATION:SINGLETON PRESENTATION: cephalic FETAL ACTIVITY:          Heart rate         154          The fetus is active. AMNIOTIC FLUID: The amniotic fluid volume is  normal PLACENTA LOCALIZATION:  anterior GRADE 0 CERVIX: Measures 4 cm ADNEXA: The ovaries are normal. simple left corpus luteal cyst 2.5 x 1.6 x 1.9 cm GESTATIONAL AGE AND   BIOMETRICS: Gestational criteria: Estimated Date of Delivery: 04/25/20 by LMP now at 608w0d Previous Scans:1          BIPARIETAL DIAMETER           2.58 cm         14+3 weeks HEAD CIRCUMFERENCE           9.78 cm         14+3 weeks ABDOMINAL CIRCUMFERENCE           7.62 cm         14 weeks FEMUR LENGTH           1.32 cm         13+6 weeks                                                       AVERAGE EGA(BY THIS SCAN):  14+1 weeks                                                 ESTIMATED FETAL WEIGHT:       89  grams, 37 % ANATOMICAL SURVEY  COMMENTS CEREBRAL VENTRICLES yes normal  CHOROID PLEXUS yes normal              ORBITS yes normal  NASAL BONE yes normal      FACIAL PROFILE yes normal              STOMACH yes normal      BLADDER yes normal              ARMS/HANDS yes normal  LEGS/FEET yes normal          SUSPECTED ABNORMALITIES:  Subchorionic hemorrhage extending behind the tip of the placenta 4.4 x 1.7 x 3 cm QUALITY OF SCAN: satisfactory TECHNICIAN COMMENTS: [place] 14 wks,measurements c/w dates,fhr 154 bpm,anterior placenta gr 0,normal right ovary,simple left corpus luteal cyst 2.5 x 1.6 x 1.9 cm,cx 4 cm,subchorionic hemorrhage extending behind the tip of the placenta 4.4 x 1.7 x 3 cm,efw 37%,discussed results with Dr.Ferguson and PT. A copy of this report including all images has been saved and backed up to a second source for retrieval if needed. All measures and details of the anatomical scan, placentation, fluid volume and pelvic anatomy are contained in that report. Amber Flora Lipps 10/26/2019 1:22 PM Clinical Impression and recommendations: I have reviewed the sonogram results above, combined with the patient's current clinical course, below are my impressions and any appropriate recommendations for management based on the sonographic findings. O Positive 1.  G1P0 Estimated Date of Delivery: 04/25/20 by  LMP and confirmed by today's  sonographic dating 2.  Normal fetal sonographic findings, specifically normal  anatomical evaluation,      no abnormalities noted 3.  Normal general sonographic findings other than small subchorionic hemorrhage at edge of anterior placenta. Recommend routine prenatal care based on this sonogram or as clinically indicated, pelvic rest , no sex while bleeding + 1 wk Tilda Burrow 10/30/2019 10:12 PM   MAU COURSE 0030 Patient examined with aforementioned details noted.  Appropriate support given to her and FOB.  Discussed plan of misoprostol administration for retained placenta. Also discussed possible need for D&E if no passage of placenta in 6 hours, for hemorrhage, fevers or other concerning symptoms. NPO for now, IV fluid ordered.  Labs ordered. Misoprostol 800 mcg buccal ordered. Offered chaplain services, she declined. [id number]  Misoprostol given as ordered 0100 Ordered for Cefotetan 2g  IV x 1 and Azithromycin 1000 mg po x 1 for infection prophylaxis 0200 Patient threw up, concerned about Azithromycin absorption 0313 Placenta delivered intact, minimal bleeding noted on exam. Support given to patient and her FOB.  Fentanyl and Toradol given for cramping. They are still deciding about disposition of fetal remains. Patient will be discharged to home.   ASSESSMENT Complete SAB (spontaneous abortion) at [redacted]w[redacted]d  PLAN Prescribed Doxycycline and Metronidazole x 7 days given concern for infection, WBC 15.3 Ibuprofen and Tramadol prescribed as needed for analgesia at home Phenergan prescribed for nausea Patient will follow up at [facility] next week, given resources for support, message sent to the office. Patient decided to leave the fetal remains here to be disposed of as per hospital protocol. Discharged to home in stable condition.  Evaluation does not show pathology or any issues that would require ongoing emergent intervention or inpatient treatment. Patient is hemodynamically stable and mentating  appropriately. Discussed findings and plan with patient and her partner, who agree with care plan. All questions answered. Return precautions discussed and outpatient follow up recommendations given.   Allergies as of 11/17/2019  No Known Allergies     Medication List    STOP taking these medications   PRENATAL VITAMIN PO     TAKE these medications   doxycycline 100 MG capsule Commonly known as: VIBRAMYCIN Take 1 capsule (100 mg total) by mouth 2 (two) times daily for 7 days.   ibuprofen 800 MG tablet Commonly known as: ADVIL Take 1 tablet (800 mg total) by mouth 3 (three) times daily with meals as needed for headache, moderate pain or cramping.   metroNIDAZOLE 500 MG tablet Commonly known as: FLAGYL Take 1 tablet (500 mg total) by mouth 2 (two) times daily for 7 days.   promethazine 25 MG tablet Commonly known as: PHENERGAN Take 1 tablet (25 mg total) by mouth every 6 (six) hours as needed for nausea or vomiting.   traMADol 50 MG tablet Commonly known as: ULTRAM Take 1 tablet (50 mg total) by mouth every 6 (six) hours as needed for severe pain.        Tereso Newcomer, MD 11/17/2019 3:50 AM

## 2019-11-17 NOTE — Discharge Instructions (Signed)
Managing Pregnancy Loss °Pregnancy loss can happen any time during a pregnancy. Often the cause is not known. It is rarely because of anything you did. Pregnancy loss in early pregnancy (during the first trimester) is called a miscarriage. This type of pregnancy loss is the most common. Pregnancy loss that happens after 20 weeks of pregnancy is called fetal demise if the baby's heart stops beating before birth. Fetal demise is much less common. Some women experience spontaneous labor shortly after fetal demise resulting in a stillborn birth (stillbirth). °Any pregnancy loss can be devastating. You will need to recover both physically and emotionally. Most women are able to get pregnant again after a pregnancy loss and deliver a healthy baby. °How to manage emotional recovery ° °Pregnancy loss is very hard emotionally. You may feel many different emotions while you grieve. You may feel sad and angry. You may also feel guilty. It is normal to have periods of crying. Emotional recovery can take longer than physical recovery. It is different for everyone. °Taking these steps can help you in managing this loss: °· Remember that it is unlikely you did anything to cause the pregnancy loss. °· Share your thoughts and feelings with friends, family, and your partner. Remember that your partner is also recovering emotionally. °· Make sure you have a good support system. Do not spend too much time alone. °· Meet with a pregnancy loss counselor or join a pregnancy loss support group. °· Get enough sleep and eat a healthy diet. Return to regular exercise when you have recovered physically. °· Do not use drugs or alcohol to manage your emotions. °· Consider seeing a mental health professional to help you recover emotionally. °· Ask a friend or loved one to help you decide what to do with any clothing and nursery items you received for your baby. °In the case of a stillbirth, many women benefit from taking additional steps in the  grieving process. You may want to: °· Hold your baby after the birth. °· Name your baby. °· Request a birth certificate. °· Create a keepsake such as handprints or footprints. °· Dress your baby and have a picture taken. °· Make funeral arrangements. °· Ask for a baptism or blessing. °Hospitals have staff members who can help you with all these arrangements. °How to recognize emotional stress °It is normal to have emotional stress after a pregnancy loss. But emotional stress that lasts a long time or becomes severe requires treatment. Watch out for these signs of severe emotional stress: °· Sadness, anger, or guilt that is not going away and is interfering with your normal activities. °· Relationship problems that have occurred or gotten worse since the pregnancy loss. °· Signs of depression that last longer than 2 weeks. These may include: °? Sadness. °? Anxiety. °? Hopelessness. °? Loss of interest in activities you enjoy. °? Inability to concentrate. °? Trouble sleeping or sleeping too much. °? Loss of appetite or overeating. °? Thoughts of death or of hurting yourself. °Follow these instructions at home: °· Take over-the-counter and prescription medicines only as told by your health care provider. °· Rest at home until your energy level returns. Return to your normal activities as told by your health care provider. Ask your health care provider what activities are safe for you. °· When you are ready, meet with your health care provider to discuss steps to take for a future pregnancy. °· Keep all follow-up visits as told by your health care provider. This is important. °  Where to find support  To help you and your partner with the process of grieving, talk with your health care provider or seek counseling.  Consider meeting with others who have experienced pregnancy loss. Ask your health care provider about support groups and resources. Where to find more information  U.S. Department of Health and Human  Services Office on Women's Health: www.womenshealth.gov  American Pregnancy Association: www.americanpregnancy.org Contact a health care provider if:  You continue to experience grief, sadness, or lack of motivation for everyday activities, and those feelings do not improve over time.  You are struggling to recover emotionally, especially if you are using alcohol or substances to help. Get help right away if:  You have thoughts of hurting yourself or others. If you ever feel like you may hurt yourself or others, or have thoughts about taking your own life, get help right away. You can go to your nearest emergency department or call:  Your local emergency services (911 in the [place]  A suicide crisis helpline, such as the National Suicide Prevention Lifeline at 1-800-273-8255. This is open 24 hours a day. Summary  Any pregnancy loss can be difficult physically and emotionally.  You may experience many different emotions while you grieve. Emotional recovery can last longer than physical recovery.  It is normal to have emotional stress after a pregnancy loss. But emotional stress that lasts a long time or becomes severe requires treatment.  See your health care provider if you are struggling emotionally after a pregnancy loss. This information is not intended to replace advice given to you by your health care provider. Make sure you discuss any questions you have with your health care provider. Document Revised: 06/22/2018 Document Reviewed: 05/13/2017 Elsevier Patient Education  2020 Elsevier Inc.      Miscarriage A miscarriage is the loss of an unborn baby (fetus) before the 20th week of pregnancy. Most miscarriages happen during the first 3 months of pregnancy. Sometimes, a miscarriage can happen before a woman knows that she is pregnant. Having a miscarriage can be an emotional experience. If you have had a miscarriage, talk with your health care provider about any questions you  may have about miscarrying, the grieving process, and your plans for future pregnancy. What are the causes? A miscarriage may be caused by:  Problems with the genes or chromosomes of the fetus. These problems make it impossible for the baby to develop normally. They are often the result of random errors that occur early in the development of the baby, and are not passed from parent to child (not inherited).  Infection of the cervix or uterus.  Conditions that affect hormone balance in the body.  Problems with the cervix, such as the cervix opening and thinning before pregnancy is at term (cervical insufficiency).  Problems with the uterus. These may include: ? A uterus with an abnormal shape. ? Fibroids in the uterus. ? Congenital abnormalities. These are problems that were present at birth.  Certain medical conditions.  Smoking, drinking alcohol, or using drugs.  Injury (trauma). In many cases, the cause of a miscarriage is not known. What are the signs or symptoms? Symptoms of this condition include:  Vaginal bleeding or spotting, with or without cramps or pain.  Pain or cramping in the abdomen or lower back.  Passing fluid, tissue, or blood clots from the vagina. How is this diagnosed? This condition may be diagnosed based on:  A physical exam.  Ultrasound.  Blood tests.  Urine tests. How   is this treated? Treatment for a miscarriage is sometimes not necessary if you naturally pass all the tissue that was in your uterus. If necessary, this condition may be treated with:  Dilation and curettage (D&C). This is a procedure in which the cervix is stretched open and the lining of the uterus (endometrium) is scraped. This is done only if tissue from the fetus or placenta remains in the body (incomplete miscarriage).  Medicines, such as: ? Antibiotic medicine, to treat infection. ? Medicine to help the body pass any remaining tissue. ? Medicine to reduce (contract) the  size of the uterus. These medicines may be given if you have a lot of bleeding. If you have Rh negative blood and your baby was Rh positive, you will need a shot of a medicine called Rh immunoglobulinto protect your future babies from Rh blood problems. "Rh-negative" and "Rh-positive" refer to whether or not the blood has a specific protein found on the surface of red blood cells (Rh factor). Follow these instructions at home: Medicines   Take over-the-counter and prescription medicines only as told by your health care provider.  If you were prescribed antibiotic medicine, take it as told by your health care provider. Do not stop taking the antibiotic even if you start to feel better.  Do not take NSAIDs, such as aspirin and ibuprofen, unless they are approved by your health care provider. These medicines can cause bleeding. Activity  Rest as directed. Ask your health care provider what activities are safe for you.  Have someone help with home and family responsibilities during this time. General instructions  Keep track of the number of sanitary pads you use each day and how soaked (saturated) they are. Write down this information.  Monitor the amount of tissue or blood clots that you pass from your vagina. Save any large amounts of tissue for your health care provider to examine.  Do not use tampons, douche, or have sex until your health care provider approves.  To help you and your partner with the process of grieving, talk with your health care provider or seek counseling.  When you are ready, meet with your health care provider to discuss any important steps you should take for your health. Also, discuss steps you should take to have a healthy pregnancy in the future.  Keep all follow-up visits as told by your health care provider. This is important. Where to find more information  The American Congress of Obstetricians and Gynecologists: www.acog.org  U.S. Department of Health  and Human Services Office of Women's Health: www.womenshealth.gov Contact a health care provider if:  You have a fever or chills.  You have a foul smelling vaginal discharge.  You have more bleeding instead of less. Get help right away if:  You have severe cramps or pain in your back or abdomen.  You pass blood clots or tissue from your vagina that is walnut-sized or larger.  You soak more than 1 regular sanitary pad in an hour.  You become light-headed or weak.  You pass out.  You have feelings of sadness that take over your thoughts, or you have thoughts of hurting yourself. Summary  Most miscarriages happen in the first 3 months of pregnancy. Sometimes miscarriage happens before a woman even knows that she is pregnant.  Follow your health care provider's instruction for home care. Keep all follow-up appointments.  To help you and your partner with the process of grieving, talk with your health care provider or seek   information is not intended to replace advice given to you by your health care provider. Make sure you discuss any questions you have with your health care provider. Document Revised: 06/24/2018 Document Reviewed: 04/07/2016 Elsevier Patient Education  2020 [employer].

## 2019-11-17 NOTE — Progress Notes (Signed)
Dr Macon Large notified that pt passed placenta and cramping is much improved. Will come see pt

## 2019-11-17 NOTE — MAU Note (Signed)
Pt called out and cramping is a lot worse and "feels like something big is coming out". Checked and pt passed about 4 cm clot and placenta. Cramping was much better afterward. Pericare given and new pad applied.

## 2019-11-21 LAB — SURGICAL PATHOLOGY

## 2019-11-23 ENCOUNTER — Encounter: Payer: Self-pay | Admitting: *Deleted

## 2019-11-24 ENCOUNTER — Encounter: Payer: Self-pay | Admitting: Obstetrics and Gynecology

## 2019-11-24 ENCOUNTER — Other Ambulatory Visit: Payer: Self-pay

## 2019-11-24 ENCOUNTER — Ambulatory Visit (INDEPENDENT_AMBULATORY_CARE_PROVIDER_SITE_OTHER): Payer: 59 | Admitting: Obstetrics and Gynecology

## 2019-11-24 VITALS — BP 118/78

## 2019-11-24 DIAGNOSIS — O039 Complete or unspecified spontaneous abortion without complication: Secondary | ICD-10-CM | POA: Diagnosis not present

## 2019-11-24 NOTE — Progress Notes (Signed)
   [facility] Clinic Visit  @DATE @            Patient name: Tracey Hays MRN [employer]  Date of birth: 26-Dec-2000  CC & HPI:  Bertina Guthridge is a 19 y.o. female presenting today for follow-up after at-home miscarriage on 11/16/2019 at [redacted]w[redacted]d. She presented to the [facility] on 11/16/2019 and was discharged home the following day. Pathology showed fetus with no visible anatomic abnormalities, consistent with [redacted] weeks gestational age. Organs were histologically immature with autolysis and the placenta ws immature with severe acute chorioamnionitis.   Today, the patient reports no complaints. No discharge. Hx neg GC chl and RPR in july  ROS:  ROS negative  Pertinent History Reviewed:   Reviewed Medical         Past Medical History:  Diagnosis Date  . Medical history non-contributory                               Surgical Hx:    Past Surgical History:  Procedure Laterality Date  . right arm surgery     fracture   Medications: Reviewed & Updated - see associated section                       Current Outpatient Medications:  .  ibuprofen (ADVIL) 800 MG tablet, Take 1 tablet (800 mg total) by mouth 3 (three) times daily with meals as needed for headache, moderate pain or cramping., Disp: 30 tablet, Rfl: 3 .  metroNIDAZOLE (FLAGYL) 500 MG tablet, Take 1 tablet (500 mg total) by mouth 2 (two) times daily for 7 days. (Patient not taking: Reported on 11/24/2019), Disp: 14 tablet, Rfl: 0 .  promethazine (PHENERGAN) 25 MG tablet, Take 1 tablet (25 mg total) by mouth every 6 (six) hours as needed for nausea or vomiting. (Patient not taking: Reported on 11/24/2019), Disp: 30 tablet, Rfl: 2 .  traMADol (ULTRAM) 50 MG tablet, Take 1 tablet (50 mg total) by mouth every 6 (six) hours as needed for severe pain. (Patient not taking: Reported on 11/24/2019), Disp: 20 tablet, Rfl: 0   Social History: Reviewed -  reports that she has never smoked. She has never used smokeless  tobacco.  Objective Findings:  Vitals: unknown if currently breastfeeding.  PHYSICAL EXAMINATION General appearance - alert, well appearing, and in no distress Mental status - alert, oriented to person, place, and time, normal mood, behavior, speech, dress, motor activity, and thought processes Abdomen - soft, nontender, nondistended Skin - normal coloration, no rashes    Assessment & Plan:   A:  1.  Miscarriage completed  P:  1. The patient was referred to [facility]  By signing my name below, I, 01/24/2020, attest that this documentation has been prepared under the direction and in the presence of Nikki Dom, MD. Electronically Signed: Tilda Burrow Medical Scribe. 11/24/19. 11:28 AM.  I personally performed the services described in this documentation, which was SCRIBED in my presence. The recorded information has been reviewed and considered accurate. It has been edited as necessary during review. 01/24/20, MD

## 2019-11-29 ENCOUNTER — Encounter: Payer: 59 | Admitting: Women's Health

## 2019-11-29 ENCOUNTER — Other Ambulatory Visit: Payer: 59

## 2020-02-13 ENCOUNTER — Encounter (HOSPITAL_COMMUNITY): Payer: Self-pay | Admitting: Anesthesiology

## 2020-02-13 ENCOUNTER — Emergency Department (HOSPITAL_COMMUNITY): Payer: 59

## 2020-02-13 ENCOUNTER — Other Ambulatory Visit: Payer: Self-pay

## 2020-02-13 ENCOUNTER — Emergency Department (HOSPITAL_COMMUNITY): Payer: 59 | Admitting: Anesthesiology

## 2020-02-13 ENCOUNTER — Encounter (HOSPITAL_COMMUNITY): Payer: Self-pay | Admitting: Radiology

## 2020-02-13 ENCOUNTER — Encounter (HOSPITAL_COMMUNITY)
Admission: EM | Disposition: A | Discharge status: Home / Self Care | Payer: Self-pay | Source: Home / Self Care | Attending: Emergency Medicine

## 2020-02-13 ENCOUNTER — Ambulatory Visit (HOSPITAL_COMMUNITY)
Admission: EM | Admit: 2020-02-13 | Discharge: 2020-02-13 | Disposition: A | Discharge status: Home / Self Care | Payer: 59 | Attending: Emergency Medicine | Admitting: Emergency Medicine

## 2020-02-13 ENCOUNTER — Encounter (HOSPITAL_COMMUNITY): Payer: Self-pay | Admitting: Emergency Medicine

## 2020-02-13 DIAGNOSIS — K661 Hemoperitoneum: Secondary | ICD-10-CM | POA: Diagnosis not present

## 2020-02-13 DIAGNOSIS — O00101 Right tubal pregnancy without intrauterine pregnancy: Secondary | ICD-10-CM | POA: Diagnosis not present

## 2020-02-13 DIAGNOSIS — Z3A00 Weeks of gestation of pregnancy not specified: Secondary | ICD-10-CM | POA: Diagnosis not present

## 2020-02-13 DIAGNOSIS — O009 Unspecified ectopic pregnancy without intrauterine pregnancy: Secondary | ICD-10-CM

## 2020-02-13 DIAGNOSIS — O039 Complete or unspecified spontaneous abortion without complication: Secondary | ICD-10-CM

## 2020-02-13 DIAGNOSIS — R1031 Right lower quadrant pain: Secondary | ICD-10-CM | POA: Diagnosis present

## 2020-02-13 DIAGNOSIS — R102 Pelvic and perineal pain: Secondary | ICD-10-CM

## 2020-02-13 DIAGNOSIS — Z20822 Contact with and (suspected) exposure to covid-19: Secondary | ICD-10-CM | POA: Diagnosis not present

## 2020-02-13 HISTORY — PX: LAPAROSCOPIC UNILATERAL SALPINGECTOMY: SHX5934

## 2020-02-13 LAB — COMPREHENSIVE METABOLIC PANEL
ALT: 17 U/L (ref 0–44)
AST: 21 U/L (ref 15–41)
Albumin: 4.3 g/dL (ref 3.5–5.0)
Alkaline Phosphatase: 42 U/L (ref 38–126)
Anion gap: 7 (ref 5–15)
BUN: 10 mg/dL (ref 6–20)
CO2: 22 mmol/L (ref 22–32)
Calcium: 9 mg/dL (ref 8.9–10.3)
Chloride: 106 mmol/L (ref 98–111)
Creatinine, Ser: 0.55 mg/dL (ref 0.44–1.00)
GFR, Estimated: >60 mL/min (ref >60)
Glucose, Bld: 129 mg/dL — ABNORMAL HIGH (ref 70–99)
Potassium: 3.9 mmol/L (ref 3.5–5.1)
Sodium: 135 mmol/L (ref 135–145)
Total Bilirubin: 0.7 mg/dL (ref 0.3–1.2)
Total Protein: 6.6 g/dL (ref 6.5–8.1)

## 2020-02-13 LAB — CBC WITH DIFFERENTIAL/PLATELET
Abs Immature Granulocytes: 0.04 10*3/uL (ref 0.00–0.07)
Basophils Absolute: 0 10*3/uL (ref 0.0–0.1)
Basophils Relative: 0 %
Eosinophils Absolute: 0 10*3/uL (ref 0.0–0.5)
Eosinophils Relative: 0 %
HCT: 33.8 % — ABNORMAL LOW (ref 36.0–46.0)
Hemoglobin: 11.2 g/dL — ABNORMAL LOW (ref 12.0–15.0)
Immature Granulocytes: 0 %
Lymphocytes Relative: 5 %
Lymphs Abs: 0.5 10*3/uL — ABNORMAL LOW (ref 0.7–4.0)
MCH: 29 pg (ref 26.0–34.0)
MCHC: 33.1 g/dL (ref 30.0–36.0)
MCV: 87.6 fL (ref 80.0–100.0)
Monocytes Absolute: 0.6 10*3/uL (ref 0.1–1.0)
Monocytes Relative: 5 %
Neutro Abs: 10.3 10*3/uL — ABNORMAL HIGH (ref 1.7–7.7)
Neutrophils Relative %: 90 %
Platelets: 238 10*3/uL (ref 150–400)
RBC: 3.86 MIL/uL — ABNORMAL LOW (ref 3.87–5.11)
RDW: 11.9 % (ref 11.5–15.5)
WBC: 11.4 10*3/uL — ABNORMAL HIGH (ref 4.0–10.5)
nRBC: 0 % (ref 0.0–0.2)

## 2020-02-13 LAB — URINALYSIS, ROUTINE W REFLEX MICROSCOPIC
Bilirubin Urine: NEGATIVE
Glucose, UA: NEGATIVE mg/dL
Ketones, ur: 20 mg/dL — AB
Leukocytes,Ua: NEGATIVE
Nitrite: NEGATIVE
Protein, ur: 100 mg/dL — AB
Specific Gravity, Urine: 1.03 (ref 1.005–1.030)
pH: 5 (ref 5.0–8.0)

## 2020-02-13 LAB — WET PREP, GENITAL
Sperm: NONE SEEN
Trich, Wet Prep: NONE SEEN
Yeast Wet Prep HPF POC: NONE SEEN

## 2020-02-13 LAB — TYPE AND SCREEN
ABO/RH(D): O POS
Antibody Screen: NEGATIVE

## 2020-02-13 LAB — RESP PANEL BY RT-PCR (FLU A&B, COVID) ARPGX2
Influenza A by PCR: NEGATIVE
Influenza B by PCR: NEGATIVE
SARS Coronavirus 2 by RT PCR: NEGATIVE

## 2020-02-13 LAB — HCG, QUANTITATIVE, PREGNANCY: hCG, Beta Chain, Quant, S: 691 m[IU]/mL — ABNORMAL HIGH (ref <5)

## 2020-02-13 LAB — LIPASE, BLOOD: Lipase: 22 U/L (ref 11–51)

## 2020-02-13 SURGERY — SALPINGECTOMY, UNILATERAL, LAPAROSCOPIC
Anesthesia: General | Laterality: Right

## 2020-02-13 MED ORDER — KETOROLAC TROMETHAMINE 30 MG/ML IJ SOLN
30.0000 mg | Freq: Once | INTRAMUSCULAR | Status: AC
  Administered 2020-02-13: 30 mg via INTRAVENOUS

## 2020-02-13 MED ORDER — FENTANYL CITRATE (PF) 100 MCG/2ML IJ SOLN
25.0000 ug | Freq: Once | INTRAMUSCULAR | Status: AC
  Administered 2020-02-13: 25 ug via INTRAVENOUS
  Filled 2020-02-13: qty 2

## 2020-02-13 MED ORDER — FENTANYL CITRATE (PF) 100 MCG/2ML IJ SOLN
INTRAMUSCULAR | Status: AC
  Filled 2020-02-13: qty 2

## 2020-02-13 MED ORDER — MIDAZOLAM HCL 5 MG/5ML IJ SOLN
INTRAMUSCULAR | Status: DC | PRN
  Administered 2020-02-13: 2 mg via INTRAVENOUS

## 2020-02-13 MED ORDER — ONDANSETRON 8 MG PO TBDP: 8.0000 mg | ORAL_TABLET | Freq: Three times a day (TID) | ORAL | 0 refills | Status: AC | PRN

## 2020-02-13 MED ORDER — LACTATED RINGERS IV SOLN: INTRAVENOUS | Status: DC

## 2020-02-13 MED ORDER — BUPIVACAINE LIPOSOME 1.3 % IJ SUSP
20.0000 mL | Freq: Once | INTRAMUSCULAR | Status: DC
  Filled 2020-02-13: qty 20

## 2020-02-13 MED ORDER — SUCCINYLCHOLINE CHLORIDE 200 MG/10ML IV SOSY
PREFILLED_SYRINGE | INTRAVENOUS | Status: AC
  Filled 2020-02-13: qty 10

## 2020-02-13 MED ORDER — KETOROLAC TROMETHAMINE 10 MG PO TABS: 10.0000 mg | ORAL_TABLET | Freq: Three times a day (TID) | ORAL | 0 refills | Status: DC | PRN

## 2020-02-13 MED ORDER — SUCCINYLCHOLINE CHLORIDE 20 MG/ML IJ SOLN
INTRAMUSCULAR | Status: DC | PRN
  Administered 2020-02-13: 100 mg via INTRAVENOUS

## 2020-02-13 MED ORDER — BUPIVACAINE LIPOSOME 1.3 % IJ SUSP
INTRAMUSCULAR | Status: AC
  Filled 2020-02-13: qty 20

## 2020-02-13 MED ORDER — CEFAZOLIN SODIUM-DEXTROSE 2-4 GM/100ML-% IV SOLN
INTRAVENOUS | Status: AC
  Filled 2020-02-13: qty 100

## 2020-02-13 MED ORDER — PROPOFOL 10 MG/ML IV BOLUS
INTRAVENOUS | Status: AC
  Filled 2020-02-13: qty 20

## 2020-02-13 MED ORDER — ONDANSETRON HCL 4 MG/2ML IJ SOLN
INTRAMUSCULAR | Status: DC | PRN
  Administered 2020-02-13: 4 mg via INTRAVENOUS

## 2020-02-13 MED ORDER — 0.9 % SODIUM CHLORIDE (POUR BTL) OPTIME
TOPICAL | Status: DC | PRN
  Administered 2020-02-13: 3000 mL
  Administered 2020-02-13: 1000 mL

## 2020-02-13 MED ORDER — ONDANSETRON 4 MG PO TBDP
4.0000 mg | ORAL_TABLET | Freq: Once | ORAL | Status: AC
  Administered 2020-02-13: 4 mg via ORAL
  Filled 2020-02-13: qty 1

## 2020-02-13 MED ORDER — MORPHINE SULFATE (PF) 2 MG/ML IV SOLN
2.0000 mg | Freq: Once | INTRAVENOUS | Status: AC
  Administered 2020-02-13: 2 mg via INTRAVENOUS
  Filled 2020-02-13: qty 1

## 2020-02-13 MED ORDER — ROCURONIUM 10MG/ML (10ML) SYRINGE FOR MEDFUSION PUMP - OPTIME
INTRAVENOUS | Status: DC | PRN
  Administered 2020-02-13: 30 mg via INTRAVENOUS

## 2020-02-13 MED ORDER — LACTATED RINGERS IV SOLN: INTRAVENOUS | Status: DC | PRN

## 2020-02-13 MED ORDER — CHLORHEXIDINE GLUCONATE 0.12 % MT SOLN
15.0000 mL | Freq: Once | OROMUCOSAL | Status: DC
  Filled 2020-02-13: qty 15

## 2020-02-13 MED ORDER — CEFAZOLIN SODIUM-DEXTROSE 2-4 GM/100ML-% IV SOLN
2.0000 g | INTRAVENOUS | Status: AC
  Administered 2020-02-13: 2 g via INTRAVENOUS

## 2020-02-13 MED ORDER — POVIDONE-IODINE 10 % EX SWAB: 2.0000 "application " | Freq: Once | CUTANEOUS | Status: DC

## 2020-02-13 MED ORDER — ORAL CARE MOUTH RINSE: 15.0000 mL | Freq: Once | OROMUCOSAL | Status: DC

## 2020-02-13 MED ORDER — SUGAMMADEX SODIUM 200 MG/2ML IV SOLN
INTRAVENOUS | Status: DC | PRN
  Administered 2020-02-13: 100 mg via INTRAVENOUS

## 2020-02-13 MED ORDER — FENTANYL CITRATE (PF) 100 MCG/2ML IJ SOLN
INTRAMUSCULAR | Status: DC | PRN
  Administered 2020-02-13 (×2): 50 ug via INTRAVENOUS

## 2020-02-13 MED ORDER — LACTATED RINGERS IV BOLUS
1000.0000 mL | Freq: Once | INTRAVENOUS | Status: AC
  Administered 2020-02-13: 1000 mL via INTRAVENOUS

## 2020-02-13 MED ORDER — ROCURONIUM BROMIDE 10 MG/ML (PF) SYRINGE
PREFILLED_SYRINGE | INTRAVENOUS | Status: AC
  Filled 2020-02-13: qty 10

## 2020-02-13 MED ORDER — PROPOFOL 10 MG/ML IV BOLUS
INTRAVENOUS | Status: DC | PRN
  Administered 2020-02-13: 150 mg via INTRAVENOUS

## 2020-02-13 MED ORDER — ONDANSETRON HCL 4 MG/2ML IJ SOLN: 4.0000 mg | Freq: Once | INTRAMUSCULAR | Status: DC | PRN

## 2020-02-13 MED ORDER — BUPIVACAINE LIPOSOME 1.3 % IJ SUSP
INTRAMUSCULAR | Status: DC | PRN
  Administered 2020-02-13: 20 mL

## 2020-02-13 MED ORDER — MIDAZOLAM HCL 2 MG/2ML IJ SOLN
INTRAMUSCULAR | Status: AC
  Filled 2020-02-13: qty 2

## 2020-02-13 MED ORDER — HYDROCODONE-ACETAMINOPHEN 5-325 MG PO TABS
1.0000 | ORAL_TABLET | Freq: Four times a day (QID) | ORAL | 0 refills | Status: DC | PRN
Start: 2020-02-13 — End: 2020-09-18

## 2020-02-13 MED ORDER — FENTANYL CITRATE (PF) 100 MCG/2ML IJ SOLN
25.0000 ug | INTRAMUSCULAR | Status: DC | PRN
  Administered 2020-02-13: 50 ug via INTRAVENOUS

## 2020-02-13 SURGICAL SUPPLY — 44 items
BAG HAMPER (MISCELLANEOUS) ×4 IMPLANT
BAG RETRIEVAL 10 (BASKET) ×1
BAG RETRIEVAL 10MM (BASKET) ×1
BLADE SURG SZ11 CARB STEEL (BLADE) ×4 IMPLANT
CLOTH BEACON ORANGE TIMEOUT ST (SAFETY) ×4 IMPLANT
COVER LIGHT HANDLE STERIS (MISCELLANEOUS) ×8 IMPLANT
COVER WAND RF STERILE (DRAPES) ×4 IMPLANT
DERMABOND ADVANCED (GAUZE/BANDAGES/DRESSINGS) ×2
DERMABOND ADVANCED .7 DNX12 (GAUZE/BANDAGES/DRESSINGS) ×2 IMPLANT
ELECT REM PT RETURN 9FT ADLT (ELECTROSURGICAL) ×4
ELECTRODE REM PT RTRN 9FT ADLT (ELECTROSURGICAL) ×2 IMPLANT
GAUZE 4X4 16PLY RFD (DISPOSABLE) ×4 IMPLANT
GLOVE BIOGEL PI IND STRL 7.0 (GLOVE) ×8 IMPLANT
GLOVE BIOGEL PI IND STRL 8 (GLOVE) ×2 IMPLANT
GLOVE BIOGEL PI INDICATOR 7.0 (GLOVE) ×8
GLOVE BIOGEL PI INDICATOR 8 (GLOVE) ×2
GLOVE ECLIPSE 6.5 STRL STRAW (GLOVE) ×4 IMPLANT
GLOVE ECLIPSE 8.0 STRL XLNG CF (GLOVE) ×4 IMPLANT
GOWN STRL REUS W/TWL LRG LVL3 (GOWN DISPOSABLE) ×8 IMPLANT
GOWN STRL REUS W/TWL XL LVL3 (GOWN DISPOSABLE) ×4 IMPLANT
INST SET LAPROSCOPIC GYN AP (KITS) ×4 IMPLANT
IV NS IRRIG 3000ML ARTHROMATIC (IV SOLUTION) ×4 IMPLANT
KIT TURNOVER KIT A (KITS) ×4 IMPLANT
MANIFOLD NEPTUNE II (INSTRUMENTS) ×4 IMPLANT
NEEDLE HYPO 18GX1.5 BLUNT FILL (NEEDLE) ×4 IMPLANT
NEEDLE HYPO 22GX1.5 SAFETY (NEEDLE) ×4 IMPLANT
PAD ARMBOARD 7.5X6 YLW CONV (MISCELLANEOUS) ×4 IMPLANT
SET BASIN LINEN APH (SET/KITS/TRAYS/PACK) ×4 IMPLANT
SET TUBE IRRIG SUCTION NO TIP (IRRIGATION / IRRIGATOR) ×4 IMPLANT
SHEARS HARMONIC ACE PLUS 36CM (ENDOMECHANICALS) ×4 IMPLANT
SLEEVE ENDOPATH XCEL 5M (ENDOMECHANICALS) ×4 IMPLANT
SOL ANTI FOG 6CC (MISCELLANEOUS) ×2 IMPLANT
SOLUTION ANTI FOG 6CC (MISCELLANEOUS) ×2
SUT VICRYL 0 UR6 27IN ABS (SUTURE) ×4 IMPLANT
SUT VICRYL AB 3-0 FS1 BRD 27IN (SUTURE) ×4 IMPLANT
SYR 10ML LL (SYRINGE) ×4 IMPLANT
SYR 20ML LL LF (SYRINGE) ×8 IMPLANT
SYS BAG RETRIEVAL 10MM (BASKET) ×2
SYSTEM BAG RETRIEVAL 10MM (BASKET) ×2 IMPLANT
TRAY FOLEY MTR SLVR 18FR LF (CATHETERS) ×4 IMPLANT
TROCAR ENDO BLADELESS 11MM (ENDOMECHANICALS) ×4 IMPLANT
TROCAR XCEL NON-BLD 5MMX100MML (ENDOMECHANICALS) ×4 IMPLANT
TUBING INSUFFLATION HIGH FLOW (TUBING) ×4 IMPLANT
WARMER LAPAROSCOPE (MISCELLANEOUS) ×4 IMPLANT

## 2020-02-13 NOTE — Anesthesia Postprocedure Evaluation (Signed)
Anesthesia Post Note  Patient: Tracey Hays  Procedure(s) Performed: LAPAROSCOPIC RIGHT SALPINGECTOMY (Right ) DIAGNOSTIC LAPAROSCOPY WITH REMOVAL OF ECTOPIC PREGNANCY  Patient location during evaluation: PACU Anesthesia Type: General Level of consciousness: awake Pain management: pain level controlled Vital Signs Assessment: post-procedure vital signs reviewed and stable Respiratory status: spontaneous breathing Cardiovascular status: blood pressure returned to baseline Anesthetic complications: no   No complications documented.   Last Vitals:  Vitals:   02/13/20 1904 02/13/20 2035  BP: 110/60 (!) (P) 103/57  Pulse: (!) 116 (!) 134  Resp: 20 17  Temp:  (P) 37.3 C  SpO2: 100% (P) 100%    Last Pain:  Vitals:   02/13/20 1904  TempSrc:   PainSc: 2                  Windell Norfolk

## 2020-02-13 NOTE — Op Note (Signed)
Preoperative diagnosis: right ectopic pregnancy, ruptured with 300 cc hemoperitoneum  Postoperative diagnosis: Same  Procedure: Laparoscopic right salpingectomy for ruptured right ectopic pregnancy   Surgeon: Lazaro Arms   Anesthesia: Gen. Endotracheal   Findings:    Intraoperatively the patient had about 300cc of hemoperitoneum and once the blood was cleared out she had a ruptured right ectopic pregnancy.  Left tube was normal.  Some blood in the perihepatic gutter.   Description of operation: Patient was taken to the operating room and placed in the supine position where she underwent general endotracheal anesthesia. She was placed in dorsal lithotomy position. She was prepped and draped in usual sterile fashion. Foley catheter was placed. Incision was made in the umbilicus and a varies needle was placed peritoneal cavity with one pass that difficulty. The peritoneal cavity was insufflated. A 1011 non-bladed trocar was placed using a video laparoscope under direct visualization without difficulty. An incision was made in the right and left lower quadrant and 5 mm non-bladed trochars were placed in each site without difficulty under direct visualization. There was a significant amount of blood in the belly the time of surgery and I estimated at 300 cc judging by what I was able to suction out.    The fallopian tube had been ruptured. Harmonic scalpel was used and a salpingectomy was performed. There was good hemostasis. The pelvis was irrigated and hemostasis once again confirmed. The left fallopian tube was completely normal and there were no other intraperitoneal abnormalities appreciated.   The trochars were removed and the gas was allowed to escape from the abdomen. The 2-5 mm trocar sites were closed and injected with a total of 10 cc of exparel. The umbilical fascia was closed with single 2-0 Vicryl suture and the subcutaneous tissue was also closed using Vicryl. The skin was closed and.  10 cc of expael was injected here as well.   The patient remained hemodynamically stable throughout the entire procedure was awakened from anesthesia and taken to the recovery room in good stable condition with all counts being correct.   She received 2 g of Ancef and 30 mg of Toradol prophylactically. There was no real intraoperative blood loss only the hemoperitoneum which was appreciated and present upon peritoneal entry.   Lazaro Arms, MD 02/13/2020 8:42 PM

## 2020-02-13 NOTE — ED Provider Notes (Signed)
[facility] EMERGENCY DEPARTMENT Provider Note   CSN: [id number] Arrival date & time: 02/13/20  [id number]     History Chief Complaint  Patient presents with  . Abdominal Pain    Tracey Hays is a 19 y.o. female.  HPI 19 year old female with a history of spontaneous abortion presents to the ER with complaints of 2 days of lower abdominal pain.  Patient describes the pain as sharp and stabbing, mostly located in her right lower quadrant but it has now traveled to the center of her abdomen.  She has taken some ibuprofen and nausea medicine that she cannot recall, with little relief.  She has had one episode of vomiting this morning which was nonbloody.  She denies any fevers or chills.  Denies any dysuria or hematuria.  She has had some light bleeding which she attributes to the start of her period.  She is not sure if this is menstrual cramps or something more.  She states that she had a miscarriage approximately 2 months ago.  She is sexually active with her husband, they normally do use the barrier method, however states that there was one episode 1 week ago where they did not.  She does still have her appendix.  Last bowel movement was yesterday and normal.  No diarrhea.    Past Medical History:  Diagnosis Date  . Medical history non-contributory     Patient Active Problem List   Diagnosis Date Noted  . SAB (spontaneous abortion) 11/16/2019    Past Surgical History:  Procedure Laterality Date  . right arm surgery     fracture     OB History    Gravida  2   Para      Term      Preterm      AB  1   Living        SAB      TAB      Ectopic      Multiple      Live Births              Family History  Problem Relation Age of Onset  . Migraines Mother   . Anxiety disorder Sister   . Migraines Sister     Social History   Tobacco Use  . Smoking status: Never Smoker  . Smokeless tobacco: Never Used  Vaping Use  . Vaping Use: Never used  Substance Use  Topics  . Alcohol use: Not Currently  . Drug use: Never    Home Medications Prior to Admission medications   Medication Sig Start Date End Date Taking? Authorizing Provider  ibuprofen (ADVIL) 800 MG tablet Take 1 tablet (800 mg total) by mouth 3 (three) times daily with meals as needed for headache, moderate pain or cramping. 11/17/19   Anyanwu, Jethro BastosUgonna A, MD  promethazine (PHENERGAN) 25 MG tablet Take 1 tablet (25 mg total) by mouth every 6 (six) hours as needed for nausea or vomiting. Patient not taking: Reported on 11/24/2019 11/17/19   Anyanwu, Jethro BastosUgonna A, MD  traMADol (ULTRAM) 50 MG tablet Take 1 tablet (50 mg total) by mouth every 6 (six) hours as needed for severe pain. Patient not taking: Reported on 11/24/2019 11/17/19   Tereso NewcomerAnyanwu, Ugonna A, MD    Allergies    Patient has no known allergies.  Review of Systems   Review of Systems  Constitutional: Negative for chills and fever.  HENT: Negative for ear pain and sore throat.   Eyes: Negative for pain  and visual disturbance.  Respiratory: Negative for cough and shortness of breath.   Cardiovascular: Negative for chest pain and palpitations.  Gastrointestinal: Positive for abdominal pain, nausea and vomiting. Negative for constipation and diarrhea.  Genitourinary: Positive for pelvic pain, vaginal bleeding and vaginal discharge. Negative for dysuria and hematuria.  Musculoskeletal: Negative for arthralgias and back pain.  Skin: Negative for color change and rash.  Neurological: Negative for seizures and syncope.  All other systems reviewed and are negative.   Physical Exam Updated Vital Signs BP 114/69   Pulse (!) 107   Temp 97.7 F (36.5 C) (Oral)   Resp 18   Ht 5' (1.524 m)   Wt 49.9 kg   LMP 02/12/2020 (Exact Date)   SpO2 99%   BMI 21.48 kg/m   Physical Exam Vitals and nursing note reviewed.  Constitutional:      General: She is not in acute distress.    Appearance: She is well-developed.  HENT:     Head: Normocephalic  and atraumatic.  Eyes:     Conjunctiva/sclera: Conjunctivae normal.  Cardiovascular:     Rate and Rhythm: Normal rate and regular rhythm.     Heart sounds: No murmur heard.   Pulmonary:     Effort: Pulmonary effort is normal. No respiratory distress.     Breath sounds: Normal breath sounds.  Abdominal:     Palpations: Abdomen is soft.     Tenderness: There is abdominal tenderness in the right lower quadrant, epigastric area and left lower quadrant. Positive signs include McBurney's sign.  Genitourinary:    Vagina: Bleeding present.     Cervix: No cervical motion tenderness.     Adnexa: Right adnexa normal and left adnexa normal.       Right: No tenderness.         Left: No tenderness.    Musculoskeletal:     Cervical back: Neck supple.  Skin:    General: Skin is warm and dry.     Capillary Refill: Capillary refill takes less than 2 seconds.  Neurological:     General: No focal deficit present.     Mental Status: She is alert.  Psychiatric:        Mood and Affect: Mood normal.        Behavior: Behavior normal.     ED Results / Procedures / Treatments   Labs (all labs ordered are listed, but only abnormal results are displayed) Labs Reviewed  WET PREP, GENITAL - Abnormal; Notable for the following components:      Result Value   Clue Cells Wet Prep HPF POC RARE (*)    WBC, Wet Prep HPF POC FEW (*)    All other components within normal limits  CBC WITH DIFFERENTIAL/PLATELET - Abnormal; Notable for the following components:   WBC 11.4 (*)    RBC 3.86 (*)    Hemoglobin 11.2 (*)    HCT 33.8 (*)    Neutro Abs 10.3 (*)    Lymphs Abs 0.5 (*)    All other components within normal limits  COMPREHENSIVE METABOLIC PANEL - Abnormal; Notable for the following components:   Glucose, Bld 129 (*)    All other components within normal limits  HCG, QUANTITATIVE, PREGNANCY - Abnormal; Notable for the following components:   hCG, Beta Chain, Quant, S 691 (*)    All other components  within normal limits  URINALYSIS, ROUTINE W REFLEX MICROSCOPIC - Abnormal; Notable for the following components:   APPearance TURBID (*)  Hgb urine dipstick LARGE (*)    Ketones, ur 20 (*)    Protein, ur 100 (*)    Bacteria, UA RARE (*)    Non Squamous Epithelial 0-5 (*)    All other components within normal limits  RESP PANEL BY RT-PCR (FLU A&B, COVID) ARPGX2  LIPASE, BLOOD  TYPE AND SCREEN  GC/CHLAMYDIA PROBE AMP ([place]) NOT AT [facility]    EKG None  Radiology MR PELVIS WO CONTRAST  Result Date: 02/13/2020 CLINICAL DATA:  First trimester pregnancy. Right lower quadrant pain. Nausea and vomiting. Possible appendicitis. EXAM: MRI ABDOMEN AND PELVIS WITHOUT CONTRAST TECHNIQUE: Multiplanar multisequence MR imaging of the abdomen and pelvis was performed. No intravenous contrast was administered. COMPARISON:  Ultrasound earlier today FINDINGS: COMBINED FINDINGS FOR BOTH MR ABDOMEN AND PELVIS Lower chest: No acute findings. Hepatobiliary: No mass visualized on this unenhanced exam. Gallbladder is unremarkable. No evidence of biliary ductal dilatation. Pancreas: No mass or inflammatory process visualized on this unenhanced exam. Spleen:  Within normal limits in size. Adrenals/Urinary tract:  No evidence of mass or hydronephrosis. Stomach/Bowel: No evidence of obstruction or abnormal bowel wall thickening. Although the appendix is not directly visualized, no inflammatory process seen in region of the cecum or right lower quadrant. Vascular/Lymphatic: No pathologically enlarged lymph nodes identified. No evidence of abdominal aortic aneurysm. Reproductive: Uterus is normal in size and appearance. No evidence of intrauterine gestational sac. Both ovaries are normal in size and appearance. Moderate complex ascites is seen in the pelvis and abdomen. A mass is seen in the central pelvis superior to the uterus which extends into the lower abdomen. This measures approximately 8.2 x 7.3 cm and shows  heterogeneous T2 hypointensity. Characterization is limited without IV contrast, however this is suspicious for hematoma. Other:  None. Musculoskeletal:  No suspicious bone lesions identified. IMPRESSION: 8 cm mass in the central pelvis and lower abdomen, suspicious for hematoma. Moderate complex ascites in the pelvis and abdomen, suspicious for hemoperitoneum. As no intrauterine gestational sac is seen on this exam or recent ultrasound, this is highly suspicious for a ruptured ectopic pregnancy. No definite radiographic signs of appendicitis. Critical Value/emergent results were called by telephone at the time of interpretation on 02/13/2020 at 5:40 pm to provider Dr. Kennis Carina , who verbally acknowledged these results. Electronically Signed   By: Danae Orleans M.D.   On: 02/13/2020 17:43   MR ABDOMEN WO CONTRAST  Result Date: 02/13/2020 CLINICAL DATA:  First trimester pregnancy. Right lower quadrant pain. Nausea and vomiting. Possible appendicitis. EXAM: MRI ABDOMEN AND PELVIS WITHOUT CONTRAST TECHNIQUE: Multiplanar multisequence MR imaging of the abdomen and pelvis was performed. No intravenous contrast was administered. COMPARISON:  Ultrasound earlier today FINDINGS: COMBINED FINDINGS FOR BOTH MR ABDOMEN AND PELVIS Lower chest: No acute findings. Hepatobiliary: No mass visualized on this unenhanced exam. Gallbladder is unremarkable. No evidence of biliary ductal dilatation. Pancreas: No mass or inflammatory process visualized on this unenhanced exam. Spleen:  Within normal limits in size. Adrenals/Urinary tract:  No evidence of mass or hydronephrosis. Stomach/Bowel: No evidence of obstruction or abnormal bowel wall thickening. Although the appendix is not directly visualized, no inflammatory process seen in region of the cecum or right lower quadrant. Vascular/Lymphatic: No pathologically enlarged lymph nodes identified. No evidence of abdominal aortic aneurysm. Reproductive: Uterus is normal in size and  appearance. No evidence of intrauterine gestational sac. Both ovaries are normal in size and appearance. Moderate complex ascites is seen in the pelvis and abdomen. A mass is seen  in the central pelvis superior to the uterus which extends into the lower abdomen. This measures approximately 8.2 x 7.3 cm and shows heterogeneous T2 hypointensity. Characterization is limited without IV contrast, however this is suspicious for hematoma. Other:  None. Musculoskeletal:  No suspicious bone lesions identified. IMPRESSION: 8 cm mass in the central pelvis and lower abdomen, suspicious for hematoma. Moderate complex ascites in the pelvis and abdomen, suspicious for hemoperitoneum. As no intrauterine gestational sac is seen on this exam or recent ultrasound, this is highly suspicious for a ruptured ectopic pregnancy. No definite radiographic signs of appendicitis. Critical Value/emergent results were called by telephone at the time of interpretation on 02/13/2020 at 5:40 pm to provider Dr. Kennis Carina , who verbally acknowledged these results. Electronically Signed   By: Danae Orleans M.D.   On: 02/13/2020 17:43   US OB LESS THAN 14 WEEKS WITH OB TRANSVAGINAL  Result Date: 02/13/2020 CLINICAL DATA:  RIGHT lower quadrant pain, pregnant, quantitative beta HCG 691 EXAM: OBSTETRIC <14 WK [place] AND TRANSVAGINAL OB US TECHNIQUE: Both transabdominal and transvaginal ultrasound examinations were performed for complete evaluation of the gestation as well as the maternal uterus, adnexal regions, and pelvic cul-de-sac. Transvaginal technique was performed to assess early pregnancy. COMPARISON:  None. FINDINGS: Intrauterine gestational sac: None identified Yolk sac:  N/A Embryo:  N/A Cardiac Activity: N/A Heart Rate: N/A  bpm MSD:   mm    w     d CRL:    mm    w    d                  [place] EDC: Subchorionic hemorrhage:  N/A . Maternal uterus/adnexae: Uterus anteverted, normal in appearance. No uterine mass or gestational sac identified.  Endometrial complex unremarkable, 6 mm thick. LEFT ovary normal size and morphology, 3.6 x 2.4 x 1.4 cm. RIGHT ovary is not visualized. Complex free fluid in pelvis consistent with hemoperitoneum. Heterogeneous amorphous material within the RIGHT adnexa likely represent blood clot. No discrete adnexal mass visualized. IMPRESSION: No intrauterine gestation identified. Complex free fluid/blood within the peritoneal cavity in the abdomen and pelvis with additional amorphous material in the RIGHT adnexa likely representing blood clot. Findings are highly suspicious for a ruptured ectopic pregnancy. Findings called to Dr. Pilar Plate on emergency room on 02/13/2020 at 1720 hours. Electronically Signed   By: Ulyses Southward M.D.   On: 02/13/2020 17:25    Procedures Procedures (including critical care time)  Medications Ordered in ED Medications  ondansetron (ZOFRAN-ODT) disintegrating tablet 4 mg (4 mg Oral Given 02/13/20 1126)  fentaNYL (SUBLIMAZE) injection 25 mcg (25 mcg Intravenous Given 02/13/20 1324)  morphine 2 MG/ML injection 2 mg (2 mg Intravenous Given 02/13/20 1736)  lactated ringers bolus 1,000 mL (1,000 mLs Intravenous New Bag/Given 02/13/20 1800)    ED Course  I have reviewed the triage vital signs and the nursing notes.  Pertinent labs & imaging results that were available during my care of the patient were reviewed by me and considered in my medical decision making (see chart for details).  Clinical Course as of Feb 12 1834  Tue Feb 13, 2020  1736 Dr. Tinnie Gens    [MB]    Clinical Course User Index [MB] Leone Brand   MDM Rules/Calculators/A&P                          19 year old female lower abdominal pain x2 days Presentation, she is  alert, oriented, nontoxic-appearing, no acute distress, though does appear to be in pain.  Vitals on arrival with some tachycardia, however afebrile.  Blood pressure of 101/76 which I attribute to body habitus.  Physical exam with right lower  quadrant pain, left lower quadrant pain and some local tenderness.  She has no flank tenderness on exam.  Pelvic exam with no cervical motion or adnexal tenderness.  There were blood products in the vaginal vault.  DDx includes appendicitis, TOA, ovarian torsion, ectopic, retained blood products from incomplete abortion, PID  Labs reviewed and interpreted by myself -CBC with leukocytosis of 11.4, hemoglobin of 11.2 -CMP without any significant electrode abnormalities, normal renal and liver function tests -Lipase is normal -Wet prep with rare clue cells, few WBCs.  Patient is not complaining of any vaginal discharge -hCG quant of 691.  MDM: Patient with right lower quadrant tenderness, leukocytosis, concern for appendicitis.  She also has a positive pregnancy with quantitative hCG of 691.  Patient did have a miscarriage approximately 2 months ago, at approximately 17 weeks.  Patient could be potentially pregnant again, or this could be a downtrending hCG from her prior pregnancy.  There is also concern for ectopic pregnancy or retained blood products of conception.  Given positive pregnancy test, will forego the CT.  Patient was also seen and evaluated by Dr.Bero.  Will order pelvic ultrasound, ultrasound of the appendix.  Will likely require also an MRI if this is negative to rule out appendicitis.  Patient treated with fentanyl, morphine for pain.  4:49PM: Received call from [place] tech that they had noted free fluid in the pelvis and noted a corpus leuteum on the left. Due to this, as per discussed w/ my supervising physician Dr. Pilar Plate, we decided to move forward to w/ MRI of the abdomen/pelvis to rule out appendicitis in the setting of a positive pregnancy test which disqualifies her from a CT scan.   5:25PM Dr. Pilar Plate received a call from radiology confirming suspicion for ruptured ectopic pregnancy on ultrasound and MRI. Consulted Dr. Shawnie Pons w/ OBGYN who asked me to contact Dr. Despina Hidden. Pt will be typed  and screened, COVID pending.   5:44PM:  Spoke w/ Dr. Despina Hidden who will look over the patient's workup and call me back.   5:59PM: Received call from Dr. Despina Hidden requesting phone number for [facility]. Phone number provided.    6:34PM: Spoke w/ Dr. Despina Hidden who will admit the patient for laparoscopic surgery. Pt remains hemodynamically stable here in the ED  Pt was seen and evaluated by Dr. Pilar Plate who is agreeable to the above plan and disposition.   Final Clinical Impression(s) / ED Diagnoses Final diagnoses:  Ruptured ectopic pregnancy    Rx / DC Orders ED Discharge Orders    None       Leone Brand 02/13/20 1835    Sabas Sous, MD 02/13/20 [phone]

## 2020-02-13 NOTE — Discharge Instructions (Signed)
PATIENT INSTRUCTIONS POST-ANESTHESIA  IMMEDIATELY FOLLOWING SURGERY:  Do not drive or operate machinery for the first twenty four hours after surgery.  Do not make any important decisions for twenty four hours after surgery or while taking narcotic pain medications or sedatives.  If you develop intractable nausea and vomiting or a severe headache please notify your doctor immediately.  FOLLOW-UP:  Please make an appointment with your surgeon as instructed. You do not need to follow up with anesthesia unless specifically instructed to do so.  WOUND CARE INSTRUCTIONS (if applicable):  Keep a dry clean dressing on the anesthesia/puncture wound site if there is drainage.  Once the wound has quit draining you may leave it open to air.  Generally you should leave the bandage intact for twenty four hours unless there is drainage.  If the epidural site drains for more than 36-48 hours please call the anesthesia department.  QUESTIONS?:  Please feel free to call your physician or the hospital operator if you have any questions, and they will be happy to assist you.      Ectopic Pregnancy  An ectopic pregnancy happens when a fertilized egg grows outside the womb (uterus). The fertilized egg cannot stay alive outside of the womb. This problem often happens in a fallopian tube. It is often caused by damage to the tube. If this problem is found early, you may be treated with medicine that stops the egg from growing. If your tube tears or bursts open (ruptures), you will bleed inside. Often, there is very bad pain in the lower belly. This is an emergency. You will need surgery. Get help right away. Follow these instructions at home: After being treated with medicine or surgery:  Rest and limit your activity for as long as told by your doctor.  Until your doctor says that it is safe: ? Do not lift anything that is heavier than 10 lb (4.5 kg) or the limit that your doctor tells you. ? Avoid exercise and any  movement that takes a lot of effort.  To prevent problems when pooping (constipation): ? Eat a healthy diet. This includes:  Fruits.  Vegetables.  Whole grains. ? Drink 6-8 glasses of water a day. Contact a doctor if: Get help right away if:  You have sudden and very bad pain in your belly.  You have very bad pain in your shoulders or neck.  You have pain that gets worse and is not helped by medicine.  You have: ? A fever or chills. ? Vaginal bleeding. ? Redness or swelling at the site of a surgical cut (incision).  You feel sick to your stomach (nauseous) or you throw up (vomit).  You feel dizzy or weak.  You feel light-headed or you pass out (faint). Summary  An ectopic pregnancy happens when a fertilized egg grows outside the womb (uterus).  If this problem is found early, you may be treated with medicine that stops the egg from growing.  If your tube tears or bursts open (ruptures), you will need surgery. This is an emergency. Get help right away. This information is not intended to replace advice given to you by your health care provider. Make sure you discuss any questions you have with your health care provider. Document Revised: 02/12/2017 Document Reviewed: 03/26/2016 Elsevier Patient Education  2020 [employer].

## 2020-02-13 NOTE — ED Triage Notes (Signed)
Pt c/o abdominal pain that began last night. She states she has nausea and has vomited once.

## 2020-02-13 NOTE — ED Notes (Signed)
Iv attempt right forearm unsuccessful

## 2020-02-13 NOTE — ED Notes (Signed)
Pelvic exam completed.

## 2020-02-13 NOTE — H&P (Signed)
Preoperative History and Physical  Tracey Hays is a 19 y.o. G2P0010 with Patient's last menstrual period was 02/12/2020 (exact date). admitted for a laparoscopic management of ruptured ectopic pregnancy on the right.  Sonogram reveals hematoma and HCG 691.  Of course, could be rupture hemorrhagic corpus luteum, will not know definitively until laparoscopy.    PMH:    Past Medical History:  Diagnosis Date  . Medical history non-contributory     PSH:     Past Surgical History:  Procedure Laterality Date  . right arm surgery     fracture    POb/GynH:      OB History    Gravida  2   Para      Term      Preterm      AB  1   Living        SAB      TAB      Ectopic      Multiple      Live Births              SH:   Social History   Tobacco Use  . Smoking status: Never Smoker  . Smokeless tobacco: Never Used  Vaping Use  . Vaping Use: Never used  Substance Use Topics  . Alcohol use: Not Currently  . Drug use: Never    FH:    Family History  Problem Relation Age of Onset  . Migraines Mother   . Anxiety disorder Sister   . Migraines Sister      Allergies: No Known Allergies  Medications:      No current facility-administered medications for this encounter.  Current Outpatient Medications:  .  ibuprofen (ADVIL) 800 MG tablet, Take 1 tablet (800 mg total) by mouth 3 (three) times daily with meals as needed for headache, moderate pain or cramping., Disp: 30 tablet, Rfl: 3 .  promethazine (PHENERGAN) 25 MG tablet, Take 1 tablet (25 mg total) by mouth every 6 (six) hours as needed for nausea or vomiting. (Patient not taking: Reported on 11/24/2019), Disp: 30 tablet, Rfl: 2 .  traMADol (ULTRAM) 50 MG tablet, Take 1 tablet (50 mg total) by mouth every 6 (six) hours as needed for severe pain. (Patient not taking: Reported on 11/24/2019), Disp: 20 tablet, Rfl: 0  Review of Systems:   Review of Systems  Constitutional: Negative for fever, chills, weight  loss, malaise/fatigue and diaphoresis.  HENT: Negative for hearing loss, ear pain, nosebleeds, congestion, sore throat, neck pain, tinnitus and ear discharge.   Eyes: Negative for blurred vision, double vision, photophobia, pain, discharge and redness.  Respiratory: Negative for cough, hemoptysis, sputum production, shortness of breath, wheezing and stridor.   Cardiovascular: Negative for chest pain, palpitations, orthopnea, claudication, leg swelling and PND.  Gastrointestinal: Positive for abdominal pain. Negative for heartburn, nausea, vomiting, diarrhea, constipation, blood in stool and melena.  Genitourinary: Negative for dysuria, urgency, frequency, hematuria and flank pain.  Musculoskeletal: Negative for myalgias, back pain, joint pain and falls.  Skin: Negative for itching and rash.  Neurological: Negative for dizziness, tingling, tremors, sensory change, speech change, focal weakness, seizures, loss of consciousness, weakness and headaches.  Endo/Heme/Allergies: Negative for environmental allergies and polydipsia. Does not bruise/bleed easily.  Psychiatric/Behavioral: Negative for depression, suicidal ideas, hallucinations, memory loss and substance abuse. The patient is not nervous/anxious and does not have insomnia.      PHYSICAL EXAM:  Blood pressure 114/69, pulse (!) 107, temperature 97.7 F (36.5 C), temperature source  Oral, resp. rate 18, height 5' (1.524 m), weight 49.9 kg, last menstrual period 02/12/2020, SpO2 99 %, unknown if currently breastfeeding.    Vitals reviewed. Constitutional: She is oriented to person, place, and time. She appears well-developed and well-nourished.  HENT:  Head: Normocephalic and atraumatic.  Right Ear: External ear normal.  Left Ear: External ear normal.  Nose: Nose normal.  Mouth/Throat: Oropharynx is clear and moist.  Eyes: Conjunctivae and EOM are normal. Pupils are equal, round, and reactive to light. Right eye exhibits no discharge.  Left eye exhibits no discharge. No scleral icterus.  Neck: Normal range of motion. Neck supple. No tracheal deviation present. No thyromegaly present.  Cardiovascular: Normal rate, regular rhythm, normal heart sounds and intact distal pulses.  Exam reveals no gallop and no friction rub.   No murmur heard. Respiratory: Effort normal and breath sounds normal. No respiratory distress. She has no wheezes. She has no rales. She exhibits no tenderness.  GI: Soft. Bowel sounds are normal. She exhibits no distension and no mass. There is tenderness. There is no rebound and no guarding.  Genitourinary:    Pelvic per sonogram, deferred Musculoskeletal: Normal range of motion. She exhibits no edema and no tenderness.  Neurological: She is alert and oriented to person, place, and time. She has normal reflexes. She displays normal reflexes. No cranial nerve deficit. She exhibits normal muscle tone. Coordination normal.  Skin: Skin is warm and dry. No rash noted. No erythema. No pallor.  Psychiatric: She has a normal mood and affect. Her behavior is normal. Judgment and thought content normal.    Labs: Results for orders placed or performed during the hospital encounter of 02/13/20 (from the past 336 hour(s))  Wet prep, genital   Collection Time: 02/13/20 11:32 AM   Specimen: PATH Cytology Cervicovaginal Ancillary Only  Result Value Ref Range   Yeast Wet Prep HPF POC NONE SEEN NONE SEEN   Trich, Wet Prep NONE SEEN NONE SEEN   Clue Cells Wet Prep HPF POC RARE (A) NONE SEEN   WBC, Wet Prep HPF POC FEW (A) NONE SEEN   Sperm NONE SEEN   CBC with Differential   Collection Time: 02/13/20 12:26 PM  Result Value Ref Range   WBC 11.4 (H) 4.0 - 10.5 K/uL   RBC 3.86 (L) 3.87 - 5.11 MIL/uL   Hemoglobin 11.2 (L) 12.0 - 15.0 g/dL   HCT 51.0 (L) 36 - 46 %   MCV 87.6 80.0 - 100.0 fL   MCH 29.0 26.0 - 34.0 pg   MCHC 33.1 30.0 - 36.0 g/dL   RDW 25.8 52.7 - 78.2 %   Platelets 238 150 - 400 K/uL   nRBC 0.0 0.0 -  0.2 %   Neutrophils Relative % 90 %   Neutro Abs 10.3 (H) 1.7 - 7.7 K/uL   Lymphocytes Relative 5 %   Lymphs Abs 0.5 (L) 0.7 - 4.0 K/uL   Monocytes Relative 5 %   Monocytes Absolute 0.6 0.1 - 1.0 K/uL   Eosinophils Relative 0 %   Eosinophils Absolute 0.0 0.0 - 0.5 K/uL   Basophils Relative 0 %   Basophils Absolute 0.0 0.0 - 0.1 K/uL   Immature Granulocytes 0 %   Abs Immature Granulocytes 0.04 0.00 - 0.07 K/uL  Comprehensive metabolic panel   Collection Time: 02/13/20 12:26 PM  Result Value Ref Range   Sodium 135 135 - 145 mmol/L   Potassium 3.9 3.5 - 5.1 mmol/L   Chloride 106 98 - 111  mmol/L   CO2 22 22 - 32 mmol/L   Glucose, Bld 129 (H) 70 - 99 mg/dL   BUN 10 6 - 20 mg/dL   Creatinine, Ser 2.03 0.44 - 1.00 mg/dL   Calcium 9.0 8.9 - 55.9 mg/dL   Total Protein 6.6 6.5 - 8.1 g/dL   Albumin 4.3 3.5 - 5.0 g/dL   AST 21 15 - 41 U/L   ALT 17 0 - 44 U/L   Alkaline Phosphatase 42 38 - 126 U/L   Total Bilirubin 0.7 0.3 - 1.2 mg/dL   GFR, Estimated >74 >16 mL/min   Anion gap 7 5 - 15  Lipase, blood   Collection Time: 02/13/20 12:26 PM  Result Value Ref Range   Lipase 22 11 - 51 U/L  hCG, quantitative, pregnancy   Collection Time: 02/13/20 12:26 PM  Result Value Ref Range   hCG, Beta Chain, Quant, S 691 (H) <5 mIU/mL  Urinalysis, Routine w reflex microscopic Urine, Clean Catch   Collection Time: 02/13/20  5:10 PM  Result Value Ref Range   Color, Urine YELLOW YELLOW   APPearance TURBID (A) CLEAR   Specific Gravity, Urine 1.030 1.005 - 1.030   pH 5.0 5.0 - 8.0   Glucose, UA NEGATIVE NEGATIVE mg/dL   Hgb urine dipstick LARGE (A) NEGATIVE   Bilirubin Urine NEGATIVE NEGATIVE   Ketones, ur 20 (A) NEGATIVE mg/dL   Protein, ur 384 (A) NEGATIVE mg/dL   Nitrite NEGATIVE NEGATIVE   Leukocytes,Ua NEGATIVE NEGATIVE   RBC / HPF 6-10 0 - 5 RBC/hpf   WBC, UA 6-10 0 - 5 WBC/hpf   Bacteria, UA RARE (A) NONE SEEN   Squamous Epithelial / LPF 11-20 0 - 5   Mucus PRESENT    Amorphous  Crystal PRESENT    Non Squamous Epithelial 0-5 (A) NONE SEEN    EKG: No orders found for this or any previous visit.  Imaging Studies: MR PELVIS WO CONTRAST  Result Date: 02/13/2020 CLINICAL DATA:  First trimester pregnancy. Right lower quadrant pain. Nausea and vomiting. Possible appendicitis. EXAM: MRI ABDOMEN AND PELVIS WITHOUT CONTRAST TECHNIQUE: Multiplanar multisequence MR imaging of the abdomen and pelvis was performed. No intravenous contrast was administered. COMPARISON:  Ultrasound earlier today FINDINGS: COMBINED FINDINGS FOR BOTH MR ABDOMEN AND PELVIS Lower chest: No acute findings. Hepatobiliary: No mass visualized on this unenhanced exam. Gallbladder is unremarkable. No evidence of biliary ductal dilatation. Pancreas: No mass or inflammatory process visualized on this unenhanced exam. Spleen:  Within normal limits in size. Adrenals/Urinary tract:  No evidence of mass or hydronephrosis. Stomach/Bowel: No evidence of obstruction or abnormal bowel wall thickening. Although the appendix is not directly visualized, no inflammatory process seen in region of the cecum or right lower quadrant. Vascular/Lymphatic: No pathologically enlarged lymph nodes identified. No evidence of abdominal aortic aneurysm. Reproductive: Uterus is normal in size and appearance. No evidence of intrauterine gestational sac. Both ovaries are normal in size and appearance. Moderate complex ascites is seen in the pelvis and abdomen. A mass is seen in the central pelvis superior to the uterus which extends into the lower abdomen. This measures approximately 8.2 x 7.3 cm and shows heterogeneous T2 hypointensity. Characterization is limited without IV contrast, however this is suspicious for hematoma. Other:  None. Musculoskeletal:  No suspicious bone lesions identified. IMPRESSION: 8 cm mass in the central pelvis and lower abdomen, suspicious for hematoma. Moderate complex ascites in the pelvis and abdomen, suspicious for  hemoperitoneum. As no intrauterine gestational sac is  seen on this exam or recent ultrasound, this is highly suspicious for a ruptured ectopic pregnancy. No definite radiographic signs of appendicitis. Critical Value/emergent results were called by telephone at the time of interpretation on 02/13/2020 at 5:40 pm to provider Dr. Kennis Carina , who verbally acknowledged these results. Electronically Signed   By: Danae Orleans M.D.   On: 02/13/2020 17:43   MR ABDOMEN WO CONTRAST  Result Date: 02/13/2020 CLINICAL DATA:  First trimester pregnancy. Right lower quadrant pain. Nausea and vomiting. Possible appendicitis. EXAM: MRI ABDOMEN AND PELVIS WITHOUT CONTRAST TECHNIQUE: Multiplanar multisequence MR imaging of the abdomen and pelvis was performed. No intravenous contrast was administered. COMPARISON:  Ultrasound earlier today FINDINGS: COMBINED FINDINGS FOR BOTH MR ABDOMEN AND PELVIS Lower chest: No acute findings. Hepatobiliary: No mass visualized on this unenhanced exam. Gallbladder is unremarkable. No evidence of biliary ductal dilatation. Pancreas: No mass or inflammatory process visualized on this unenhanced exam. Spleen:  Within normal limits in size. Adrenals/Urinary tract:  No evidence of mass or hydronephrosis. Stomach/Bowel: No evidence of obstruction or abnormal bowel wall thickening. Although the appendix is not directly visualized, no inflammatory process seen in region of the cecum or right lower quadrant. Vascular/Lymphatic: No pathologically enlarged lymph nodes identified. No evidence of abdominal aortic aneurysm. Reproductive: Uterus is normal in size and appearance. No evidence of intrauterine gestational sac. Both ovaries are normal in size and appearance. Moderate complex ascites is seen in the pelvis and abdomen. A mass is seen in the central pelvis superior to the uterus which extends into the lower abdomen. This measures approximately 8.2 x 7.3 cm and shows heterogeneous T2 hypointensity.  Characterization is limited without IV contrast, however this is suspicious for hematoma. Other:  None. Musculoskeletal:  No suspicious bone lesions identified. IMPRESSION: 8 cm mass in the central pelvis and lower abdomen, suspicious for hematoma. Moderate complex ascites in the pelvis and abdomen, suspicious for hemoperitoneum. As no intrauterine gestational sac is seen on this exam or recent ultrasound, this is highly suspicious for a ruptured ectopic pregnancy. No definite radiographic signs of appendicitis. Critical Value/emergent results were called by telephone at the time of interpretation on 02/13/2020 at 5:40 pm to provider Dr. Kennis Carina , who verbally acknowledged these results. Electronically Signed   By: Danae Orleans M.D.   On: 02/13/2020 17:43   US OB LESS THAN 14 WEEKS WITH OB TRANSVAGINAL  Result Date: 02/13/2020 CLINICAL DATA:  RIGHT lower quadrant pain, pregnant, quantitative beta HCG 691 EXAM: OBSTETRIC <14 WK [place] AND TRANSVAGINAL OB US TECHNIQUE: Both transabdominal and transvaginal ultrasound examinations were performed for complete evaluation of the gestation as well as the maternal uterus, adnexal regions, and pelvic cul-de-sac. Transvaginal technique was performed to assess early pregnancy. COMPARISON:  None. FINDINGS: Intrauterine gestational sac: None identified Yolk sac:  N/A Embryo:  N/A Cardiac Activity: N/A Heart Rate: N/A  bpm MSD:   mm    w     d CRL:    mm    w    d                  [place] EDC: Subchorionic hemorrhage:  N/A . Maternal uterus/adnexae: Uterus anteverted, normal in appearance. No uterine mass or gestational sac identified. Endometrial complex unremarkable, 6 mm thick. LEFT ovary normal size and morphology, 3.6 x 2.4 x 1.4 cm. RIGHT ovary is not visualized. Complex free fluid in pelvis consistent with hemoperitoneum. Heterogeneous amorphous material within the RIGHT adnexa likely represent blood clot. No  discrete adnexal mass visualized. IMPRESSION: No intrauterine  gestation identified. Complex free fluid/blood within the peritoneal cavity in the abdomen and pelvis with additional amorphous material in the RIGHT adnexa likely representing blood clot. Findings are highly suspicious for a ruptured ectopic pregnancy. Findings called to Dr. Pilar Plate on emergency room on 02/13/2020 at 1720 hours. Electronically Signed   By: Ulyses Southward M.D.   On: 02/13/2020 17:25      Assessment: Probable ruptured right ectopic pregnancy, intraperiotneal hemorrhage with HCG 691 Imaging does not reveal adnexal mass but cannot delineate due to the hematoma  Plan: Laparoscopic management of probable right ectopic pregnancy, possible salpingectomy, right side  Lazaro Arms 02/13/2020 6:38 PM

## 2020-02-13 NOTE — Anesthesia Procedure Notes (Signed)
Procedure Name: Intubation Date/Time: 02/13/2020 7:36 PM Performed by: Louann Sjogren, MD Pre-anesthesia Checklist: Patient identified, Emergency Drugs available, Suction available, Patient being monitored and Timeout performed Patient Re-evaluated:Patient Re-evaluated prior to induction Oxygen Delivery Method: Circle system utilized Preoxygenation: Pre-oxygenation with 100% oxygen Induction Type: IV induction Ventilation: Mask ventilation without difficulty Laryngoscope Size: 3 and Mac Grade View: Grade II Tube type: Oral Tube size: 7.5 mm Number of attempts: 2 Airway Equipment and Method: Patient positioned with wedge pillow and Stylet Secured at: 21 cm Tube secured with: Tape Dental Injury: Teeth and Oropharynx as per pre-operative assessment  Comments: Required head re-positioning.

## 2020-02-13 NOTE — Transfer of Care (Signed)
Immediate Anesthesia Transfer of Care Note  Patient: Tracey Hays  Procedure(s) Performed: LAPAROSCOPIC RIGHT SALPINGECTOMY (Right ) DIAGNOSTIC LAPAROSCOPY WITH REMOVAL OF ECTOPIC PREGNANCY  Patient Location: PACU  Anesthesia Type:General  Level of Consciousness: awake and alert   Airway & Oxygen Therapy: Patient Spontanous Breathing  Post-op Assessment: Report given to RN, Post -op Vital signs reviewed and stable and Patient moving all extremities  Post vital signs: Reviewed and stable  Last Vitals:  Vitals Value Taken Time  BP 103/57 02/13/20 2036  Temp    Pulse 122 02/13/20 2039  Resp 25 02/13/20 2039  SpO2 100 % 02/13/20 2039  Vitals shown include unvalidated device data.  Last Pain:  Vitals:   02/13/20 1904  TempSrc:   PainSc: 2          Complications: No complications documented.

## 2020-02-13 NOTE — ED Notes (Signed)
In ultrasound at present

## 2020-02-13 NOTE — Anesthesia Preprocedure Evaluation (Signed)
Anesthesia Evaluation  Patient identified by MRN, date of birth, ID band Patient awake    Reviewed: Allergy & Precautions, H&P , NPO status , Patient's Chart, lab work & pertinent test results, reviewed documented beta blocker date and time   Airway Mallampati: I  TM Distance: >3 FB Neck ROM: full    Dental no notable dental hx.    Pulmonary neg pulmonary ROS,    Pulmonary exam normal breath sounds clear to auscultation       Cardiovascular Exercise Tolerance: Good negative cardio ROS   Rhythm:regular Rate:Normal     Neuro/Psych negative neurological ROS  negative psych ROS   GI/Hepatic negative GI ROS, Neg liver ROS,   Endo/Other  negative endocrine ROS  Renal/GU negative Renal ROS  negative genitourinary   Musculoskeletal negative musculoskeletal ROS (+)   Abdominal   Peds negative pediatric ROS (+)  Hematology negative hematology ROS (+)   Anesthesia Other Findings   Reproductive/Obstetrics negative OB ROS                             Anesthesia Physical Anesthesia Plan  ASA: I  Anesthesia Plan: General   Post-op Pain Management:    Induction:   PONV Risk Score and Plan: Ondansetron  Airway Management Planned:   Additional Equipment:   Intra-op Plan:   Post-operative Plan:   Informed Consent: I have reviewed the patients History and Physical, chart, labs and discussed the procedure including the risks, benefits and alternatives for the proposed anesthesia with the patient or authorized representative who has indicated his/her understanding and acceptance.     Dental Advisory Given  Plan Discussed with: CRNA  Anesthesia Plan Comments:         Anesthesia Quick Evaluation

## 2020-02-14 LAB — GC/CHLAMYDIA PROBE AMP (~~LOC~~) NOT AT ARMC
Chlamydia: NEGATIVE
Comment: NEGATIVE
Comment: NORMAL
Neisseria Gonorrhea: NEGATIVE

## 2020-02-15 ENCOUNTER — Encounter (HOSPITAL_COMMUNITY): Payer: Self-pay | Admitting: Obstetrics & Gynecology

## 2020-02-15 LAB — SURGICAL PATHOLOGY

## 2020-02-19 ENCOUNTER — Telehealth: Payer: Self-pay

## 2020-02-19 NOTE — Telephone Encounter (Signed)
Pt's mother-in-law called office inquiring if the pt should still come for her 1030 appt for that morning. She stated that the pt was feeling badly, c/o nausea, vomiting, sharp stabbing abdominal pains, cramping, dizziness, syncopal episode the previous night. This RN asked to speak to the pt personally on the phone. The pt sounded in distress with same complaints and pain 10/10, afraid to sit up d/t severe dizziness. Pt told to go to the ER to be checked. Pt's mother-in-law requested advice on which hospital to go to. The front desk canceled her appt and advised her to go to [facility] since it's closer and she would be seen quicker. Pt instructed to call if anything changed.

## 2020-02-22 ENCOUNTER — Other Ambulatory Visit: Payer: Self-pay

## 2020-02-22 ENCOUNTER — Ambulatory Visit (INDEPENDENT_AMBULATORY_CARE_PROVIDER_SITE_OTHER): Payer: 59 | Admitting: Obstetrics & Gynecology

## 2020-02-22 ENCOUNTER — Encounter: Payer: Self-pay | Admitting: Obstetrics & Gynecology

## 2020-02-22 VITALS — BP 111/71 | HR 96 | Ht 60.0 in | Wt 115.0 lb

## 2020-02-22 DIAGNOSIS — Z9889 Other specified postprocedural states: Secondary | ICD-10-CM

## 2020-02-22 NOTE — Progress Notes (Signed)
  HPI: Patient returns for routine postoperative follow-up having undergone laparoscopic ectopic pregnancy  on 02/13/20.  The patient's immediate postoperative recovery has been unremarkable. Since hospital discharge the patient reports no problems.   Current Outpatient Medications: ibuprofen (ADVIL) 200 MG tablet, Take 200 mg by mouth every 6 (six) hours as needed., Disp: , Rfl:  HYDROcodone-acetaminophen (NORCO/VICODIN) 5-325 MG tablet, Take 1 tablet by mouth every 6 (six) hours as needed. (Patient not taking: Reported on 02/22/2020), Disp: 15 tablet, Rfl: 0 Multiple Vitamin (MULTIVITAMIN WITH MINERALS) TABS tablet, Take 1 tablet by mouth daily. (Patient not taking: Reported on 02/22/2020), Disp: , Rfl:  ondansetron (ZOFRAN ODT) 8 MG disintegrating tablet, Take 1 tablet (8 mg total) by mouth every 8 (eight) hours as needed for nausea or vomiting. (Patient not taking: Reported on 02/22/2020), Disp: 8 tablet, Rfl: 0  No current facility-administered medications for this visit.    Blood pressure 111/71, pulse 96, height 5' (1.524 m), weight 115 lb (52.2 kg), last menstrual period 02/12/2020, not currently breastfeeding.  Physical Exam: Incision x 3 Abdomen is benign  Diagnostic Tests:   Pathology: Ectopic pregnancy  Impression: S/p laparoscopic management of ruptured right ectopic pregnancy  Plan:   Follow up: prn    Lazaro Arms, MD

## 2020-06-19 ENCOUNTER — Ambulatory Visit (INDEPENDENT_AMBULATORY_CARE_PROVIDER_SITE_OTHER): Payer: 59 | Admitting: Family Medicine

## 2020-06-19 ENCOUNTER — Other Ambulatory Visit: Payer: Self-pay

## 2020-06-19 ENCOUNTER — Encounter: Payer: Self-pay | Admitting: Family Medicine

## 2020-06-19 VITALS — BP 108/68 | HR 94 | Temp 97.2°F | Ht 60.0 in | Wt 114.2 lb

## 2020-06-19 DIAGNOSIS — R110 Nausea: Secondary | ICD-10-CM

## 2020-06-19 DIAGNOSIS — R42 Dizziness and giddiness: Secondary | ICD-10-CM | POA: Insufficient documentation

## 2020-06-19 DIAGNOSIS — M542 Cervicalgia: Secondary | ICD-10-CM

## 2020-06-19 LAB — POCT HEMOGLOBIN: Hemoglobin: 11 g/dL (ref 11–14.6)

## 2020-06-19 LAB — POCT URINE PREGNANCY: Preg Test, Ur: NEGATIVE

## 2020-06-19 MED ORDER — NAPROXEN 500 MG PO TABS: 500.0000 mg | ORAL_TABLET | Freq: Two times a day (BID) | ORAL | 0 refills | Status: DC

## 2020-06-19 NOTE — Patient Instructions (Signed)

## 2020-06-19 NOTE — Progress Notes (Addendum)
Patient ID: Tracey Hays, female    DOB: [birth year], 20 y.o.   MRN: [id number]   Chief Complaint  Patient presents with  . Neck Pain    Dizziness for few days   Subjective:  CC: nausea and dizziness   This is a new problem.  Presents today for an acute visit with a complaint of neck pain and dizziness.  Reports that the room is spinning, her neck is sore especially when she moves a certain way.  Reports that she could possibly have had an injury with her neck with a towel reports that she "tweaked" her neck.  Has had a challenging history recently, in September had a miscarriage which she lost a lot of blood, and then an ectopic pregnancy in November also losing a lot of blood.  Hemoglobin point-of-care today was 11.0.  She reports that she has been shortness of breath with walking and resting for the last 2 years.  Had Covid infection for greater then 1 year ago-did not have symptoms of shortness of breath with Covid infection..  January 2021.  Reports that she is having dizziness and nausea.  Reports no recent illnesses.  She felt better upon awakening today, feels worse as the day goes on.  Present today with her husband.    Medical History Tracey Hays has a past medical history of Medical history non-contributory.   Outpatient Encounter Medications as of 06/19/2020  Medication Sig  . naproxen (NAPROSYN) 500 MG tablet Take 1 tablet (500 mg total) by mouth 2 (two) times daily with a meal.  . HYDROcodone-acetaminophen (NORCO/VICODIN) 5-325 MG tablet Take 1 tablet by mouth every 6 (six) hours as needed. (Patient not taking: Reported on 02/22/2020)  . ibuprofen (ADVIL) 200 MG tablet Take 200 mg by mouth every 6 (six) hours as needed.  . Multiple Vitamin (MULTIVITAMIN WITH MINERALS) TABS tablet Take 1 tablet by mouth daily. (Patient not taking: Reported on 02/22/2020)  . ondansetron (ZOFRAN ODT) 8 MG disintegrating tablet Take 1 tablet (8 mg total) by mouth every 8 (eight) hours as needed for  nausea or vomiting. (Patient not taking: Reported on 02/22/2020)   No facility-administered encounter medications on file as of 06/19/2020.     Review of Systems  Constitutional: Negative for chills and fever.  HENT: Negative for congestion, ear discharge, ear pain and sore throat.   Respiratory: Negative for shortness of breath.   Cardiovascular: Negative for chest pain and leg swelling.  Gastrointestinal: Negative for abdominal pain.     Vitals BP 108/68   Pulse 94   Temp (!) 97.2 F (36.2 C) (Oral)   Ht 5' (1.524 m)   Wt 114 lb 3.2 oz (51.8 kg)   SpO2 100%   BMI 22.30 kg/m   Objective:   Physical Exam Vitals reviewed.  Constitutional:      Appearance: Normal appearance.  Neck:     Meningeal: Brudzinski's sign absent.  Cardiovascular:     Rate and Rhythm: Normal rate and regular rhythm.     Heart sounds: Normal heart sounds.  Pulmonary:     Effort: Pulmonary effort is normal.     Breath sounds: Normal breath sounds.  Abdominal:     General: Bowel sounds are normal.  Musculoskeletal:     Cervical back: Normal range of motion.  Skin:    General: Skin is warm and dry.  Neurological:     General: No focal deficit present.     Mental Status: She is alert.  Cranial Nerves: Cranial nerves are intact.     Sensory: Sensation is intact.     Motor: No weakness.     Coordination: Coordination is intact. Romberg sign negative. Coordination normal. Finger-Nose-Finger Test and Heel to [facility] Test normal.     Gait: Gait is intact.     Comments: 5/5 upper and lower extremity  Strength.  Psychiatric:        Behavior: Behavior normal.    Results for orders placed or performed in visit on 06/19/20  POCT hemoglobin  Result Value Ref Range   Hemoglobin 11.0 11 - 14.6 g/dL  POCT urine pregnancy  Result Value Ref Range   Preg Test, Ur Negative Negative     Assessment and Plan   1. Dizziness - POCT hemoglobin - POCT urine pregnancy - CBC with Differential -  Comprehensive Metabolic Panel (CMET) - Urinalysis, Routine w reflex microscopic  2. Nausea - POCT urine pregnancy - Comprehensive Metabolic Panel (CMET) - Urinalysis, Routine w reflex microscopic  3. Neck pain - naproxen (NAPROSYN) 500 MG tablet; Take 1 tablet (500 mg total) by mouth 2 (two) times daily with a meal.  Dispense: 28 tablet; Refill: 0   Will get labs and urinalysis done today.  Urine pregnancy negative.  Normal neurological exam in the office today.  No red flags.  No obvious source of infection.  Encouraged naproxen twice per day with food for 2 weeks to see if neck soreness will resolve.     Agrees with plan of care discussed today. Understands warning signs to seek further care: chest pain, shortness of breath, any significant change in health.  Understands to follow-up in 4 weeks to ensure neck symptoms have resolved, if persist, will refer.  Will notify once lab results are available.  Dorena Bodo, NP 06/19/20

## 2020-06-20 LAB — URINALYSIS, ROUTINE W REFLEX MICROSCOPIC
Bilirubin, UA: NEGATIVE
Glucose, UA: NEGATIVE
Ketones, UA: NEGATIVE
Nitrite, UA: NEGATIVE
Protein,UA: NEGATIVE
RBC, UA: NEGATIVE
Specific Gravity, UA: 1.008 (ref 1.005–1.030)
Urobilinogen, Ur: 0.2 mg/dL (ref 0.2–1.0)
pH, UA: 7.5 (ref 5.0–7.5)

## 2020-06-20 LAB — COMPREHENSIVE METABOLIC PANEL
ALT: 16 IU/L (ref 0–32)
AST: 19 IU/L (ref 0–40)
Albumin/Globulin Ratio: 2.1 (ref 1.2–2.2)
Albumin: 5.1 g/dL — ABNORMAL HIGH (ref 3.9–5.0)
Alkaline Phosphatase: 65 IU/L (ref 44–121)
BUN/Creatinine Ratio: 14 (ref 9–23)
BUN: 8 mg/dL (ref 6–20)
Bilirubin Total: 0.8 mg/dL (ref 0.0–1.2)
CO2: 22 mmol/L (ref 20–29)
Calcium: 10.6 mg/dL — ABNORMAL HIGH (ref 8.7–10.2)
Chloride: 102 mmol/L (ref 96–106)
Creatinine, Ser: 0.59 mg/dL (ref 0.57–1.00)
Globulin, Total: 2.4 g/dL (ref 1.5–4.5)
Glucose: 95 mg/dL (ref 65–99)
Potassium: 4.2 mmol/L (ref 3.5–5.2)
Sodium: 141 mmol/L (ref 134–144)
Total Protein: 7.5 g/dL (ref 6.0–8.5)
eGFR: 131 mL/min/{1.73_m2} (ref >59)

## 2020-06-20 LAB — CBC WITH DIFFERENTIAL/PLATELET
Basophils Absolute: 0 10*3/uL (ref 0.0–0.2)
Basos: 0 %
EOS (ABSOLUTE): 0 10*3/uL (ref 0.0–0.4)
Eos: 0 %
Hematocrit: 43.5 % (ref 34.0–46.6)
Hemoglobin: 14.9 g/dL (ref 11.1–15.9)
Immature Grans (Abs): 0 10*3/uL (ref 0.0–0.1)
Immature Granulocytes: 0 %
Lymphocytes Absolute: 0.9 10*3/uL (ref 0.7–3.1)
Lymphs: 20 %
MCH: 28.3 pg (ref 26.6–33.0)
MCHC: 34.3 g/dL (ref 31.5–35.7)
MCV: 83 fL (ref 79–97)
Monocytes Absolute: 0.5 10*3/uL (ref 0.1–0.9)
Monocytes: 11 %
Neutrophils Absolute: 3.2 10*3/uL (ref 1.4–7.0)
Neutrophils: 69 %
Platelets: 254 10*3/uL (ref 150–450)
RBC: 5.27 x10E6/uL (ref 3.77–5.28)
RDW: 13.8 % (ref 11.7–15.4)
WBC: 4.7 10*3/uL (ref 3.4–10.8)

## 2020-06-20 LAB — MICROSCOPIC EXAMINATION
Bacteria, UA: NONE SEEN
Casts: NONE SEEN /lpf
RBC, Urine: NONE SEEN /hpf (ref 0–2)
WBC, UA: NONE SEEN /hpf (ref 0–5)

## 2020-07-18 ENCOUNTER — Ambulatory Visit: Payer: 59 | Admitting: Family Medicine

## 2020-09-18 ENCOUNTER — Other Ambulatory Visit (HOSPITAL_COMMUNITY)
Admission: RE | Admit: 2020-09-18 | Discharge: 2020-09-18 | Disposition: A | Discharge status: Home / Self Care | Payer: 59 | Source: Ambulatory Visit | Attending: Adult Health | Admitting: Adult Health

## 2020-09-18 ENCOUNTER — Encounter: Payer: Self-pay | Admitting: Adult Health

## 2020-09-18 ENCOUNTER — Other Ambulatory Visit: Payer: Self-pay

## 2020-09-18 ENCOUNTER — Ambulatory Visit (INDEPENDENT_AMBULATORY_CARE_PROVIDER_SITE_OTHER): Payer: 59 | Admitting: Adult Health

## 2020-09-18 VITALS — BP 120/74 | HR 89 | Ht 60.0 in | Wt 115.0 lb

## 2020-09-18 DIAGNOSIS — N946 Dysmenorrhea, unspecified: Secondary | ICD-10-CM | POA: Diagnosis not present

## 2020-09-18 DIAGNOSIS — Z124 Encounter for screening for malignant neoplasm of cervix: Secondary | ICD-10-CM | POA: Diagnosis not present

## 2020-09-18 DIAGNOSIS — Z8759 Personal history of other complications of pregnancy, childbirth and the puerperium: Secondary | ICD-10-CM | POA: Diagnosis not present

## 2020-09-18 NOTE — Progress Notes (Signed)
  Subjective:     Patient ID: Tracey Hays, female   DOB: [birth year], 20 y.o.   MRN: [id number]  HPI Tracey Hays is a 20 yea rold white female,married, G2P0020, in complaining of painful periods since ectopic surgery in November. PCP is Lilyan Punt MD.   Review of Systems Has had painful periods since ectopic surgery in November Periods regular and last about 5 days No pain with sex Reviewed past medical,surgical, social and family history. Reviewed medications and allergies.     Objective:   Physical Exam BP 120/74 (BP Location: Left Arm, Patient Position: Sitting, Cuff Size: Normal)   Pulse 89   Ht 5' (1.524 m)   Wt 115 lb (52.2 kg)   LMP 09/03/2020   BMI 22.46 kg/m  Skin warm and dry.Pelvic: external genitalia is normal in appearance no lesions, vagina: white discharge with odor,urethra has no lesions or masses noted, cervix:smooth,pap with CG/CHL performed,uterus: normal size, shape and contour, non tender, no masses felt, adnexa: no masses or tenderness noted. Bladder is non tender and no masses felt.    Fall risk is low  [facility] - 09/18/20 1451       Pregnancy Intention Screening   Does the patient want to become pregnant in the next year? Ok Either Way    Does the patient's partner want to become pregnant in the next year? Ok Either Way    Would the patient like to discuss contraceptive options today? No      Contraception Wrap Up   Current Method Female Condom    End Method Female Condom    Contraception Counseling Provided No            Examination chaperoned by Malachy Mood LPN.  Assessment:     1. Dysmenorrhea Scheduled pelvic US at [facility] for 09/20/20 at 2:30 pm - US PELVIC COMPLETE WITH TRANSVAGINAL; Future Discussed options of OCs, NSAIDs, exercise and hydration and increasing diary  2. Routine Papanicolaou smear Pap sent - Cytology - PAP( [facility])  3. History of ectopic pregnancy     Plan:     Physical in 1 year Will talk when [place] back

## 2020-09-20 ENCOUNTER — Other Ambulatory Visit: Payer: Self-pay

## 2020-09-20 ENCOUNTER — Ambulatory Visit (HOSPITAL_COMMUNITY)
Admission: RE | Admit: 2020-09-20 | Discharge: 2020-09-20 | Disposition: A | Discharge status: Home / Self Care | Payer: 59 | Source: Ambulatory Visit | Attending: Adult Health | Admitting: Adult Health

## 2020-09-20 DIAGNOSIS — N946 Dysmenorrhea, unspecified: Secondary | ICD-10-CM | POA: Insufficient documentation

## 2020-09-23 LAB — CYTOLOGY - PAP
Adequacy: ABSENT
Chlamydia: NEGATIVE
Comment: NEGATIVE
Comment: NORMAL
Diagnosis: NEGATIVE
Neisseria Gonorrhea: NEGATIVE

## 2022-02-12 IMAGING — MR MR ABDOMEN W/O CM
7 of 8 series · 35 of 48 positions shown · non-contrast
Comparison: Ultrasound earlier today

CLINICAL DATA: First trimester pregnancy. Right lower quadrant
pain. Nausea and vomiting. Possible appendicitis.

EXAM:
MRI ABDOMEN AND PELVIS WITHOUT CONTRAST
TECHNIQUE: Multiplanar multisequence MR imaging of the abdomen and pelvis was
performed. No intravenous contrast was administered.

[Series 3: cor haste · coronal · 6.0mm · 1.25mm/px · 2 of 24 slices shown]
[im 1/24]
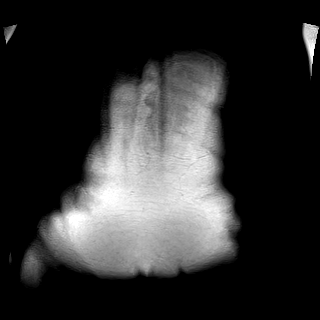
[im 24/24]
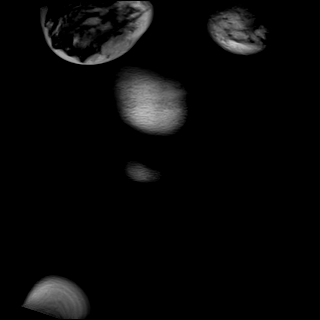

[Series 4: cor haste fs · coronal · 6.0mm · 1.25mm/px · 3 of 24 slices shown]
[im 1/24]
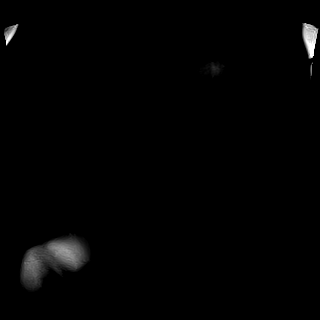
[im 12/24]
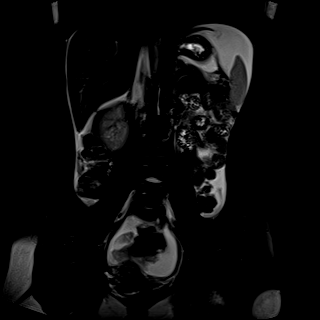
[im 24/24]
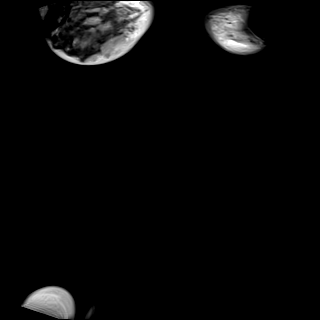

[Series 5: bSSFP · coronal · 6.0mm · 0.78mm/px · 3 of 24 slices shown (1 of 2)]
[im 1/24]
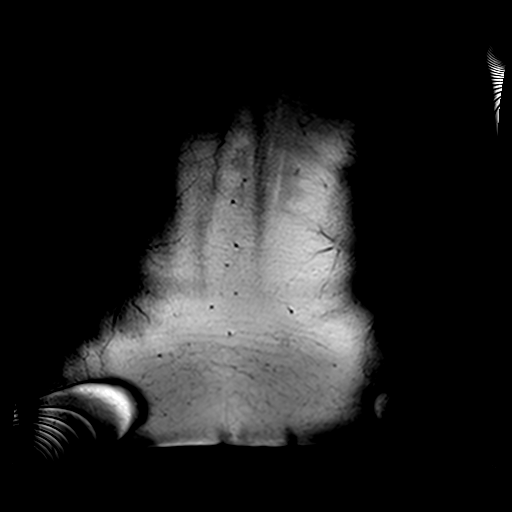
[im 12/24]
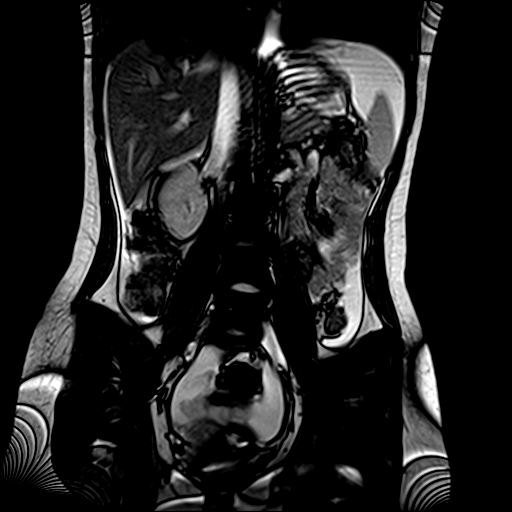
[im 24/24]
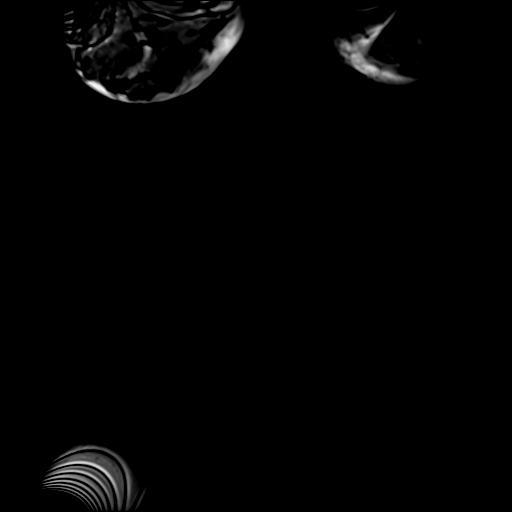

[Series 8: ax haste_comp · axial · 5.0mm · 1.00mm/px · z∈[-278,+117]mm · 8 of 67 slices shown]
[im 1/67]
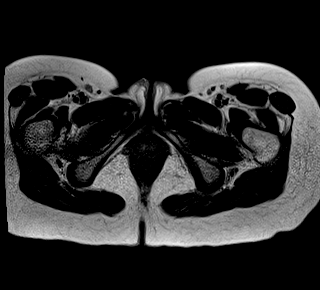
[im 10/67]
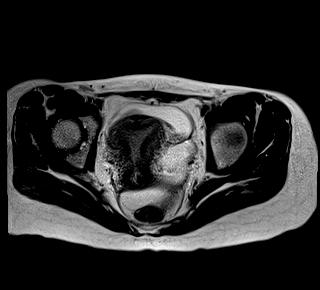
[im 19/67]
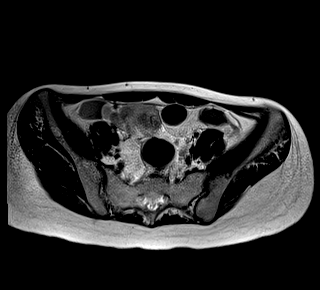
[im 29/67]
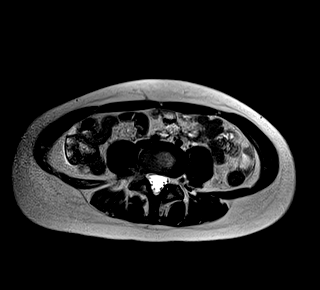
[im 38/67]
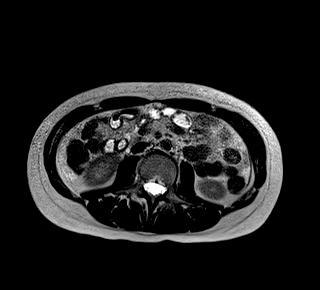
[im 48/67]
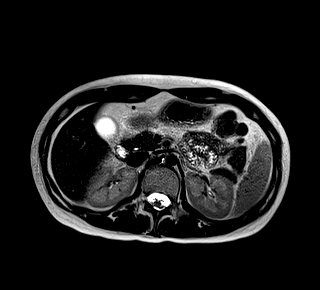
[im 57/67]
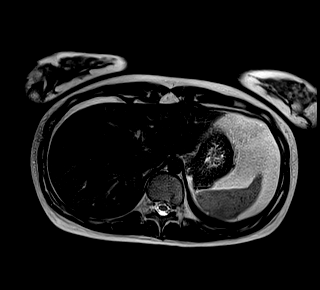
[im 67/67]
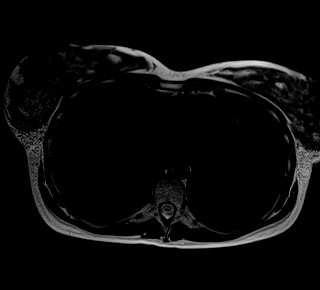

[Series 17: bSSFP · axial · 5.0mm · 0.62mm/px · z∈[-278,+117]mm · 8 of 67 slices shown (2 of 2)]
[im 1/67]
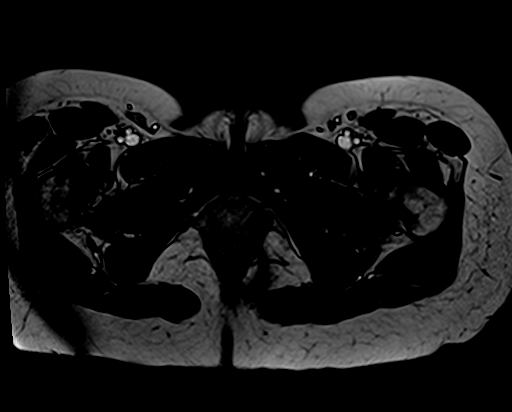
[im 10/67]
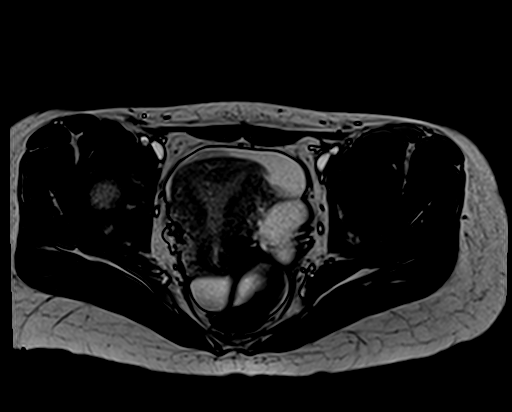
[im 19/67]
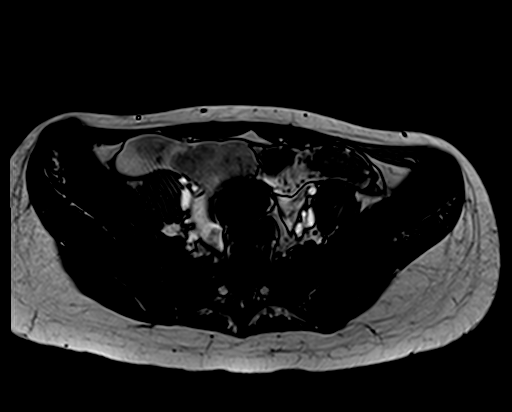
[im 29/67]
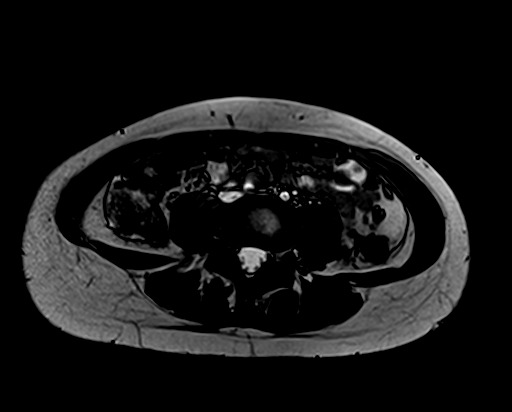
[im 38/67]
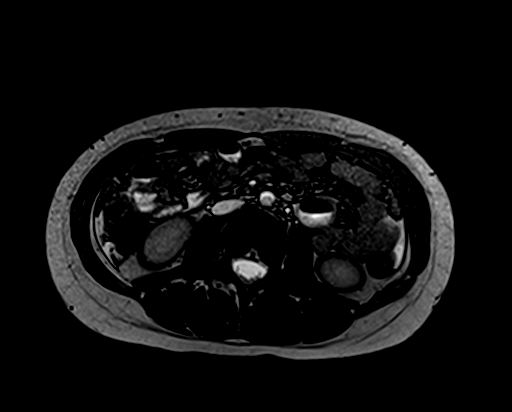
[im 48/67]
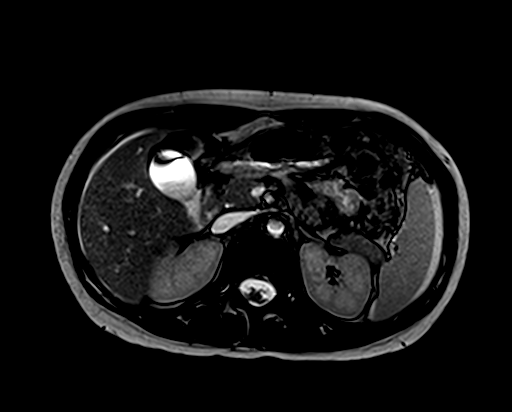
[im 57/67]
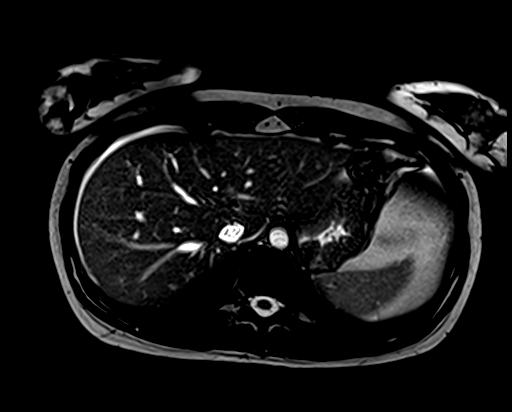
[im 67/67]
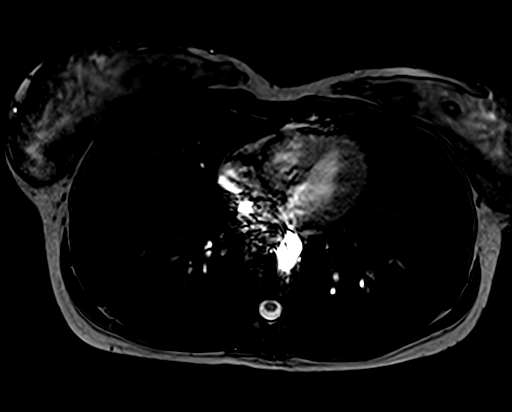

[Series 18: T1 dynamic · axial · 3.0mm · 1.00mm/px · z∈[-102,+135]mm · 8 of 80 slices shown (1 of 2)]
[im 1/80]
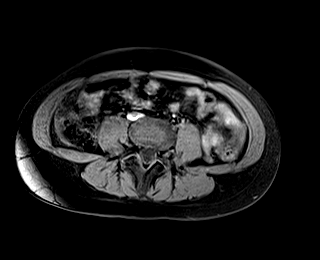
[im 10/80]
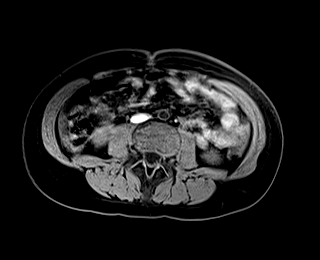
[im 20/80]
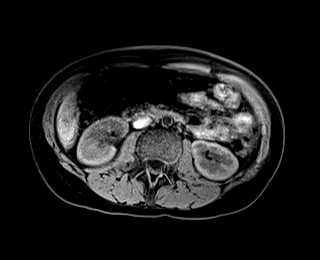
[im 30/80]
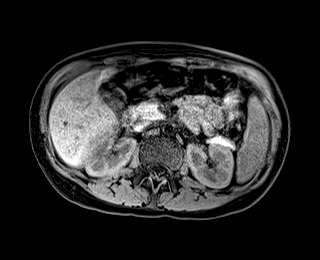
[im 50/80]
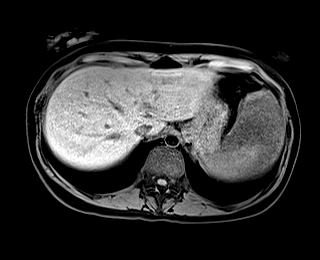
[im 60/80]
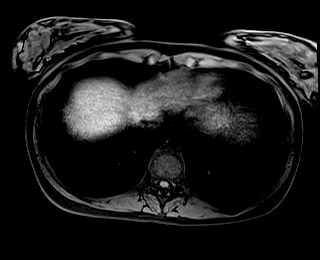
[im 70/80]
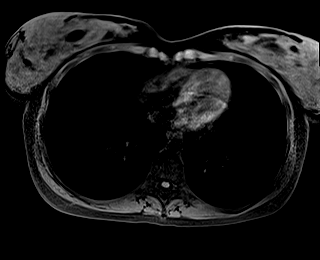
[im 80/80]
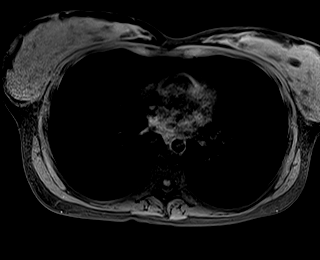

[Series 19: T1 dynamic · axial · 3.0mm · 1.00mm/px · z∈[-284,-227]mm · 3 of 60 slices shown (2 of 2)]
[im 1/60]
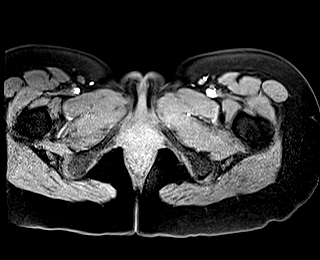
[im 10/60]
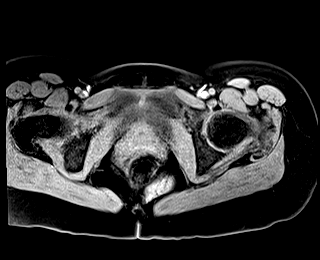
[im 20/60]
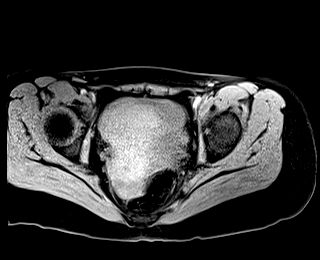

[35 of 48 positions shown; findings below may reference images not displayed]

FINDINGS: COMBINED FINDINGS FOR BOTH MR ABDOMEN AND PELVIS

Lower chest: No acute findings.

Hepatobiliary: No mass visualized on this unenhanced exam.
Gallbladder is unremarkable. No evidence of biliary ductal
dilatation.

Pancreas: No mass or inflammatory process visualized on this
unenhanced exam.

Spleen:  Within normal limits in size.

Adrenals/Urinary tract:  No evidence of mass or hydronephrosis.

Stomach/Bowel: No evidence of obstruction or abnormal bowel wall
thickening. Although the appendix is not directly visualized, no
inflammatory process seen in region of the cecum or right lower
quadrant.

Vascular/Lymphatic: No pathologically enlarged lymph nodes
identified. No evidence of abdominal aortic aneurysm.

Reproductive: Uterus is normal in size and appearance. No evidence
of intrauterine gestational sac. Both ovaries are normal in size and
appearance.

Moderate complex ascites is seen in the pelvis and abdomen. A mass
is seen in the central pelvis superior to the uterus which extends
into the lower abdomen. This measures approximately 8.2 x 7.3 cm and
shows heterogeneous T2 hypointensity. Characterization is limited
without IV contrast, however this is suspicious for hematoma.

Other:  None.

Musculoskeletal:  No suspicious bone lesions identified.
IMPRESSION: 8 cm mass in the central pelvis and lower abdomen, suspicious for
hematoma. Moderate complex ascites in the pelvis and abdomen,
suspicious for hemoperitoneum. As no intrauterine gestational sac is
seen on this exam or recent ultrasound, this is highly suspicious
for a ruptured ectopic pregnancy.

No definite radiographic signs of appendicitis.

Critical Value/emergent results were called by telephone at the time
of interpretation on 02/13/2020 at [DATE] to provider Dr. STEFFEN
BHEBHE , who verbally acknowledged these results.
